# Patient Record
Sex: Male | Born: 1985
Health system: Southern US, Community
[De-identification: ages and names within clinical notes are randomized; demographics above are authoritative.]

## PROBLEM LIST (undated history)

## (undated) DIAGNOSIS — F1410 Cocaine abuse, uncomplicated: Secondary | ICD-10-CM

## (undated) DIAGNOSIS — F329 Major depressive disorder, single episode, unspecified: Secondary | ICD-10-CM

## (undated) DIAGNOSIS — F1010 Alcohol abuse, uncomplicated: Secondary | ICD-10-CM

## (undated) DIAGNOSIS — I10 Essential (primary) hypertension: Secondary | ICD-10-CM

## (undated) DIAGNOSIS — K219 Gastro-esophageal reflux disease without esophagitis: Secondary | ICD-10-CM

---

## 2011-01-14 ENCOUNTER — Emergency Department (INDEPENDENT_AMBULATORY_CARE_PROVIDER_SITE_OTHER): Payer: Medicaid Other

## 2011-01-14 ENCOUNTER — Other Ambulatory Visit: Payer: Self-pay

## 2011-01-14 ENCOUNTER — Emergency Department (HOSPITAL_BASED_OUTPATIENT_CLINIC_OR_DEPARTMENT_OTHER)
Admission: EM | Admit: 2011-01-14 | Discharge: 2011-01-14 | Disposition: A | Discharge status: Home / Self Care | Payer: Medicaid Other | Attending: Emergency Medicine | Admitting: Emergency Medicine

## 2011-01-14 DIAGNOSIS — R066 Hiccough: Secondary | ICD-10-CM

## 2011-01-14 DIAGNOSIS — R079 Chest pain, unspecified: Secondary | ICD-10-CM

## 2011-01-14 DIAGNOSIS — K219 Gastro-esophageal reflux disease without esophagitis: Secondary | ICD-10-CM | POA: Insufficient documentation

## 2011-01-14 LAB — BASIC METABOLIC PANEL
BUN: 13 mg/dL (ref 6–23)
Chloride: 101 mEq/L (ref 96–112)
Creatinine, Ser: 0.9 mg/dL (ref 0.50–1.35)
GFR calc Af Amer: >60 mL/min (ref >60)
GFR calc non Af Amer: >60 mL/min (ref >60)
Glucose, Bld: 99 mg/dL (ref 70–99)
Potassium: 3.5 mEq/L (ref 3.5–5.1)

## 2011-01-14 MED ORDER — LANSOPRAZOLE 15 MG PO CPDR: 15.0000 mg | DELAYED_RELEASE_CAPSULE | Freq: Every day | ORAL | Status: DC

## 2011-01-14 MED ORDER — GI COCKTAIL ~~LOC~~
30.0000 mL | Freq: Once | ORAL | Status: AC
  Administered 2011-01-14: 30 mL via ORAL
  Filled 2011-01-14: qty 30

## 2011-01-14 NOTE — ED Provider Notes (Signed)
History     CSN: [id number] Arrival date & time: 01/14/2011  7:25 PM  Chief Complaint  Patient presents with  . Hiccups  . Chest Pain   HPI Comments: Pt states that he also has hiccups intermittently throughout the week  Patient is a 25 y.o. male presenting with chest pain. The history is provided by the patient. No language interpreter was used.  Chest Pain The chest pain began 5 - 7 days ago. Chest pain occurs intermittently. The chest pain is unchanged. The pain is associated with eating. At its most intense, the pain is at 5/10. The pain is currently at 0/10. The quality of the pain is described as burning. The pain does not radiate. Chest pain is worsened by eating. Pertinent negatives for primary symptoms include no fever, no syncope, no shortness of breath, no cough, no wheezing, no palpitations and no dizziness. He tried histamine-2 blockers for the symptoms. Risk factors include alcohol intake.     History reviewed. No pertinent past medical history.  History reviewed. No pertinent past surgical history.  No family history on file.  History  Substance Use Topics  . Smoking status: Current Everyday Smoker  . Smokeless tobacco: Not on file  . Alcohol Use: Yes      Review of Systems  Constitutional: Negative for fever.  Respiratory: Negative for cough, shortness of breath and wheezing.   Cardiovascular: Positive for chest pain. Negative for palpitations and syncope.  Skin:       Pt states that he also feels like he has a lump on his left chest wall  Neurological: Negative for dizziness.  All other systems reviewed and are negative.    Physical Exam  BP 124/77  Pulse 56  Temp(Src) 98.9 F (37.2 C) (Oral)  Resp 18  Ht 5\' 8"  (1.727 m)  Wt 162 lb (73.483 kg)  BMI 24.63 kg/m2  SpO2 100%  Physical Exam  Nursing note and vitals reviewed. Constitutional: He is oriented to person, place, and time. He appears well-developed and well-nourished.  HENT:  Head:  Normocephalic and atraumatic.  Neck: Normal range of motion. Neck supple.  Cardiovascular: Normal rate and regular rhythm.   Pulmonary/Chest: Effort normal and breath sounds normal.  Abdominal: Bowel sounds are normal.  Musculoskeletal: Normal range of motion.  Neurological: He is alert and oriented to person, place, and time.  Skin: Skin is warm.  Psychiatric: He has a normal mood and affect.    ED Course  Procedures  Date: 01/14/2011  Rate: 62  Rhythm: normal sinus rhythm  QRS Axis: normal  Intervals:  ST/T Wave abnormalities: normal  Conduction Disutrbances:none  Narrative Interpretation:   Old EKG Reviewed: none available   Results for orders placed during the hospital encounter of 01/14/11  BASIC METABOLIC PANEL      Component Value Range   Sodium 136  135 - 145 (mEq/L)   Potassium 3.5  3.5 - 5.1 (mEq/L)   Chloride 101  96 - 112 (mEq/L)   CO2 23  19 - 32 (mEq/L)   Glucose, Bld 99  70 - 99 (mg/dL)   BUN 13  6 - 23 (mg/dL)   Creatinine, Ser 4.09  0.50 - 1.35 (mg/dL)   Calcium 9.6  8.4 - 81.1 (mg/dL)   GFR calc non Af Amer >60  >60 (mL/min)   GFR calc Af Amer >60  >60 (mL/min)   Dg Chest 2 View  01/14/2011  *RADIOLOGY REPORT*  Clinical Data: Chest pain, hiccups  CHEST -  2 VIEW  Comparison: None.  Findings: Lungs are clear. No pleural effusion or pneumothorax.  Cardiomediastinal silhouette is within normal limits.  Visualized osseous structures are within normal limits.  IMPRESSION: Normal chest radiographs.  Original Report Authenticated By: Charline Bills, M.D.    MDM Pt is feeling better after the gi cocktail:likely gerd pt is okay to follow up as need:pt hiccups subside with pt talking      Teressa Lower, NP 01/14/11 2142

## 2011-01-14 NOTE — ED Notes (Signed)
C/o hiccups x 7 days-CP x 4 days-knot to left side of chest-denies pain at present

## 2011-01-15 NOTE — ED Provider Notes (Signed)
Medical screening examination/treatment/procedure(s) were performed by non-physician practitioner and as supervising physician I was immediately available for consultation/collaboration.   Hanley Seamen, MD 01/15/11 [phone]

## 2013-06-11 ENCOUNTER — Emergency Department (HOSPITAL_BASED_OUTPATIENT_CLINIC_OR_DEPARTMENT_OTHER)
Admission: EM | Admit: 2013-06-11 | Discharge: 2013-06-12 | Disposition: A | Discharge status: Home / Self Care | Payer: 59 | Attending: Emergency Medicine | Admitting: Emergency Medicine

## 2013-06-11 ENCOUNTER — Encounter (HOSPITAL_BASED_OUTPATIENT_CLINIC_OR_DEPARTMENT_OTHER): Payer: Self-pay | Admitting: Emergency Medicine

## 2013-06-11 DIAGNOSIS — K920 Hematemesis: Secondary | ICD-10-CM

## 2013-06-11 DIAGNOSIS — F17200 Nicotine dependence, unspecified, uncomplicated: Secondary | ICD-10-CM | POA: Insufficient documentation

## 2013-06-11 DIAGNOSIS — L739 Follicular disorder, unspecified: Secondary | ICD-10-CM

## 2013-06-11 DIAGNOSIS — R109 Unspecified abdominal pain: Secondary | ICD-10-CM | POA: Insufficient documentation

## 2013-06-11 DIAGNOSIS — L678 Other hair color and hair shaft abnormalities: Secondary | ICD-10-CM | POA: Insufficient documentation

## 2013-06-11 DIAGNOSIS — F1010 Alcohol abuse, uncomplicated: Secondary | ICD-10-CM

## 2013-06-11 DIAGNOSIS — Z79899 Other long term (current) drug therapy: Secondary | ICD-10-CM | POA: Insufficient documentation

## 2013-06-11 DIAGNOSIS — L738 Other specified follicular disorders: Secondary | ICD-10-CM | POA: Insufficient documentation

## 2013-06-11 DIAGNOSIS — R066 Hiccough: Secondary | ICD-10-CM

## 2013-06-11 HISTORY — DX: Alcohol abuse, uncomplicated: F10.10

## 2013-06-11 MED ORDER — PROMETHAZINE HCL 25 MG/ML IJ SOLN
25.0000 mg | Freq: Once | INTRAMUSCULAR | Status: AC
  Administered 2013-06-12: 25 mg via INTRAVENOUS
  Filled 2013-06-11: qty 1

## 2013-06-11 NOTE — ED Notes (Addendum)
Vomiting blood  X 3 days. Sometimes it looks black and other times it is bright red. He vomits about 3 times a day. Cough x 3 days. Has been getting hiccups since this started. Right lateral rib pain.States he is not coughing blood. He normally drinks a case and a pint of liquor everyday. He has not had any alcohol in 4 days. He is agitated and cant sit still at triage. He has blisters on his right middle finger and left index finger he wants to have checked.

## 2013-06-12 LAB — CBC WITH DIFFERENTIAL/PLATELET
Basophils Absolute: 0 10*3/uL (ref 0.0–0.1)
Basophils Relative: 0 % (ref 0–1)
EOS PCT: 2 % (ref 0–5)
Eosinophils Absolute: 0.1 10*3/uL (ref 0.0–0.7)
HCT: 42.6 % (ref 39.0–52.0)
Hemoglobin: 14.9 g/dL (ref 13.0–17.0)
LYMPHS ABS: 2 10*3/uL (ref 0.7–4.0)
LYMPHS PCT: 38 % (ref 12–46)
MCH: 33.2 pg (ref 26.0–34.0)
MCHC: 35 g/dL (ref 30.0–36.0)
MCV: 94.9 fL (ref 78.0–100.0)
Monocytes Absolute: 0.5 10*3/uL (ref 0.1–1.0)
Monocytes Relative: 10 % (ref 3–12)
NEUTROS PCT: 50 % (ref 43–77)
Neutro Abs: 2.7 10*3/uL (ref 1.7–7.7)
PLATELETS: 223 10*3/uL (ref 150–400)
RBC: 4.49 MIL/uL (ref 4.22–5.81)
RDW: 12 % (ref 11.5–15.5)
WBC: 5.3 10*3/uL (ref 4.0–10.5)

## 2013-06-12 LAB — COMPREHENSIVE METABOLIC PANEL
ALK PHOS: 49 U/L (ref 39–117)
ALT: 25 U/L (ref 0–53)
AST: 28 U/L (ref 0–37)
Albumin: 4.4 g/dL (ref 3.5–5.2)
BILIRUBIN TOTAL: 1 mg/dL (ref 0.3–1.2)
BUN: 10 mg/dL (ref 6–23)
CO2: 23 meq/L (ref 19–32)
Calcium: 9.6 mg/dL (ref 8.4–10.5)
Chloride: 102 mEq/L (ref 96–112)
Creatinine, Ser: 1 mg/dL (ref 0.50–1.35)
GLUCOSE: 99 mg/dL (ref 70–99)
POTASSIUM: 3.6 meq/L — AB (ref 3.7–5.3)
SODIUM: 140 meq/L (ref 137–147)
Total Protein: 8.3 g/dL (ref 6.0–8.3)

## 2013-06-12 LAB — URINALYSIS, ROUTINE W REFLEX MICROSCOPIC
Glucose, UA: NEGATIVE mg/dL
HGB URINE DIPSTICK: NEGATIVE
Ketones, ur: 15 mg/dL — AB
Leukocytes, UA: NEGATIVE
Nitrite: NEGATIVE
PROTEIN: NEGATIVE mg/dL
Specific Gravity, Urine: 1.031 — ABNORMAL HIGH (ref 1.005–1.030)
UROBILINOGEN UA: 2 mg/dL — AB (ref 0.0–1.0)
pH: 6 (ref 5.0–8.0)

## 2013-06-12 LAB — OCCULT BLOOD GASTRIC / DUODENUM (SPECIMEN CUP)
Occult Blood, Gastric: POSITIVE — AB
pH, Gastric: 4

## 2013-06-12 LAB — LIPASE, BLOOD: Lipase: 72 U/L — ABNORMAL HIGH (ref 11–59)

## 2013-06-12 MED ORDER — CEPHALEXIN 500 MG PO CAPS: 500.0000 mg | ORAL_CAPSULE | Freq: Four times a day (QID) | ORAL | Status: DC

## 2013-06-12 MED ORDER — CHLORPROMAZINE HCL 25 MG PO TABS
25.0000 mg | ORAL_TABLET | Freq: Once | ORAL | Status: AC
  Administered 2013-06-12: 25 mg via ORAL
  Filled 2013-06-12: qty 1

## 2013-06-12 MED ORDER — CHLORPROMAZINE HCL 25 MG PO TABS: 25.0000 mg | ORAL_TABLET | Freq: Three times a day (TID) | ORAL | Status: DC

## 2013-06-12 NOTE — ED Provider Notes (Signed)
Medical screening examination/treatment/procedure(s) were conducted as a shared visit with non-physician practitioner(s) and myself.  I personally evaluated the patient during the encounter. Patient is a 28 year old male presents with complaints of persistent hiccups for the past 3 days. He states that this is causing him to vomit. He is reporting some upper abdominal pain but states he has vomited blood. He denies any fevers or chills. He denies any diarrhea or black stool.  On exam, vitals are stable and the patient is afebrile. Head is atraumatic normocephalic heart is regular rate and rhythm without murmurs. Lungs are clear and equal. Abdomen reveals mild tenderness in the epigastric region without rebound or guarding.  Workup reveals mildly elevated lipase which could be the explanation for his symptoms. He tells me he drinks approximately a 12 pack per day of beer. His symptoms could be related to pancreatitis and I have advised him to adhere to a clear liquid diet for the next 2 days. He'll also be recommended to take Prilosec and will also give Thorazine for his persistent hiccups. He understands to return if his pain worsens, he develops high fever, or bloody stool or other new or concerning symptoms.     Geoffery Lyonsouglas Alphonse Asbridge, MD 06/12/13 [phone]

## 2013-06-12 NOTE — Discharge Instructions (Signed)
Prilosec 20 mg twice daily for the next 2 weeks.  Thorazine as needed for hiccups.  Keflex as prescribed.  Followup with your primary Dr. if not improving in the next week.   Hematemesis This condition is the vomiting of blood. CAUSES  This can happen if you have a peptic ulcer or an irritation of the throat, stomach, or small bowel. Vomiting over and over again or swallowing blood from a nosebleed, coughing or facial injury can also result in bloody vomit. Anti-inflammatory pain medicines are a common cause of this potentially dangerous condition. The most serious causes of vomiting blood include:  Ulcers (a bacteria called H. pylori is common cause of ulcers).  Clotting problems.  Alcoholism.  Cirrhosis. TREATMENT  Treatment depends on the cause and the severity of the bleeding. Small amounts of blood streaks in the vomit is not the same as vomiting large amounts of bloody or dark, coffee grounds-like material. Weakness, fainting, dehydration, anemia, and continued alcohol or drug use increase the risk. Examination may include blood, vomit, or stool tests. The presence of bloody or dark stool that tests positive for blood (Hemoccult) means the bleeding has been going on for some time. Endoscopy and imaging studies may be done. Emergency treatment may include:  IV medicines or fluids.  Blood transfusions.  Surgery. Hospital care is required for high risk patients or when IV fluids or blood is needed. Upper GI bleeding can cause shock and death if not controlled. HOME CARE INSTRUCTIONS   Your treatment does not require hospital care at this time.  Remain at rest until your condition improves.  Drink clear liquids as tolerated.  Avoid:  Alcohol.  Nicotine.  Aspirin.  Any other anti-inflammatory medicine (ibuprofen, naproxen, and many others).  Medications to suppress stomach acid or vomiting may be needed. Take all your medicine as prescribed.  Be sure to see your  caregiver for follow-up as recommended. SEEK IMMEDIATE MEDICAL CARE IF:   You have repeated vomiting, dehydration, fainting, or extreme weakness.  You are vomiting large amounts of bloody or dark material.  You pass large, dark or bloody stools. Document Released: 07/01/2004 Document Revised: 08/16/2011 Document Reviewed: 07/17/2008 [facility] Patient Information 2014 [place], [place].  Hiccups Hiccups are caused by a sudden contraction of the muscles between the ribs and the muscle under your lungs (diaphragm). When you hiccup, the top of your windpipe (glottis) closes immediately after your diaphragm contracts. This makes the typical 'hic' sound. A hiccup is a reflex that you cannot stop. Unlike other reflexes such as coughing and sneezing, hiccups do not seem to have any useful purpose. There are 3 types of hiccups:   Benign bouts: last less than 48 hours.  Persistent: last more than 48 hours but less than 1 month.  Intractable: last more than 1 month. CAUSES  Most people have bouts of hiccups from time to time. They start for no apparent reason, last a short while, and then stop. Sometimes they are due to:  A temporary swollen stomach caused by overeating or eating too fast, eating spicy foods, drinking fizzy drinks, or swallowing air.  A sudden change in temperature (very hot or cold foods or drinks, a cold shower).  Drinking alcohol or using tobacco. There are no particular tests used to diagnose hiccups. Hiccups are usually considered harmless and do not point to a serious medical condition. However, there can be underlying medical problems that may cause hiccups, such as pneumonia, diabetes, metabolic problems, tumors, abdominal infections or injuries, and  neurologic problems.You must follow up with your caregiver if your symptoms persist or become a frequent problem. TREATMENT   Most cases need no treatment. A bout of benign hiccups usually does not last long.  Medicine is  sometimes needed to stop persistent hiccups. Medicine may be given intravenously (IV) or by mouth.  Hypnosis or acupuncture may be suggested.  Surgery to affect the nerve that supplies the diaphragm may be tried in severe cases.  Treatment of an underlying cause is needed in some cases. HOME CARE INSTRUCTIONS  Popular remedies that may stop a bout of hiccups include:  Gargling ice water.  Swallowing granulated sugar.  Biting on a lemon.  Holding your breath, breathing fast, or breathing into a paper bag.  Bearing down.  Gasping after a sudden fright.  Pulling your tongue gently.  Distraction. SEEK MEDICAL CARE IF:   Hiccups last for more than 48 hours.  You are given medicine but your hiccups do not get better.  New symptoms show up.  You cannot sleep or eat due to the hiccups.  You have unexpected weight loss.  You have trouble breathing or swallowing.  You develop severe pain in your abdomen or other areas.  You develop numbness, tingling, or weakness. Document Released: 08/02/2001 Document Revised: 08/16/2011 Document Reviewed: 07/15/2010 [facility] Patient Information 2014 [place], [place].

## 2013-06-12 NOTE — ED Provider Notes (Signed)
CSN: [id number]     Arrival date & time 06/11/13  2141 History   First MD Initiated Contact with Patient 06/11/13 2338     Chief Complaint  Patient presents with  . Hematemesis   (Consider location/radiation/quality/duration/timing/severity/associated sxs/prior Treatment) HPI Comments: Pt state that he started with hiccups, vomiting and upper abdominal pain 3 days ago:pt states that the vomit was initially dark and now it is red:pt states that he drinks at least a 12 pack of beer a day:pt states that his feels like it is his reflux but he can't get it to stop with tums and zantac:denies fever or diarrhea  The history is provided by the patient. No language interpreter was used.    Past Medical History  Diagnosis Date  . Alcohol abuse    History reviewed. No pertinent past surgical history. No family history on file. History  Substance Use Topics  . Smoking status: Current Every Day Smoker  . Smokeless tobacco: Not on file  . Alcohol Use: Yes    Review of Systems  Constitutional: Negative.   Respiratory: Negative.   Cardiovascular: Negative.     Allergies  Review of patient's allergies indicates no known allergies.  Home Medications   Current Outpatient Rx  Name  Route  Sig  Dispense  Refill  . EXPIRED: lansoprazole (PREVACID) 15 MG capsule   Oral   Take 1 capsule (15 mg total) by mouth daily.   30 capsule   0   . ranitidine (ZANTAC) 150 MG tablet   Oral   Take 150 mg by mouth 2 (two) times daily.            BP 138/90  Pulse 91  Temp(Src) 99.3 F (37.4 C) (Oral)  Resp 20  Ht 5\' 7"  (1.702 m)  Wt 162 lb (73.483 kg)  BMI 25.37 kg/m2  SpO2 100% Physical Exam  Nursing note and vitals reviewed. Constitutional: He is oriented to person, place, and time. He appears well-developed and well-nourished.  HENT:  Head: Normocephalic and atraumatic.  Cardiovascular: Normal rate and regular rhythm.   Pulmonary/Chest: Effort normal and breath sounds normal.   Abdominal: Soft. Bowel sounds are normal.  Epigastric tenderness  Musculoskeletal: Normal range of motion.  Neurological: He is alert and oriented to person, place, and time.  Skin: Skin is warm and dry.  Psychiatric: He has a normal mood and affect.    ED Course  Procedures (including critical care time) Labs Review Labs Reviewed  CBC WITH DIFFERENTIAL  COMPREHENSIVE METABOLIC PANEL  LIPASE, BLOOD  URINALYSIS, ROUTINE W REFLEX MICROSCOPIC  OCCULT BLOOD GASTRIC / DUODENUM (SPECIMEN CUP)   Imaging Review No results found.  EKG Interpretation   None       MDM  No diagnosis found.  Pt left with Dr. Judd Lienelo to follow upon labs    Teressa LowerVrinda Amyre Segundo, NP 06/12/13 0040

## 2014-03-24 ENCOUNTER — Encounter (HOSPITAL_BASED_OUTPATIENT_CLINIC_OR_DEPARTMENT_OTHER): Payer: Self-pay | Admitting: Emergency Medicine

## 2014-03-24 ENCOUNTER — Emergency Department (HOSPITAL_BASED_OUTPATIENT_CLINIC_OR_DEPARTMENT_OTHER)
Admission: EM | Admit: 2014-03-24 | Discharge: 2014-03-24 | Disposition: A | Discharge status: Home / Self Care | Payer: 59 | Attending: Emergency Medicine | Admitting: Emergency Medicine

## 2014-03-24 DIAGNOSIS — Z720 Tobacco use: Secondary | ICD-10-CM | POA: Insufficient documentation

## 2014-03-24 DIAGNOSIS — J029 Acute pharyngitis, unspecified: Secondary | ICD-10-CM | POA: Diagnosis not present

## 2014-03-24 DIAGNOSIS — Z79899 Other long term (current) drug therapy: Secondary | ICD-10-CM | POA: Insufficient documentation

## 2014-03-24 DIAGNOSIS — Z20828 Contact with and (suspected) exposure to other viral communicable diseases: Secondary | ICD-10-CM | POA: Diagnosis not present

## 2014-03-24 DIAGNOSIS — Z792 Long term (current) use of antibiotics: Secondary | ICD-10-CM | POA: Diagnosis not present

## 2014-03-24 DIAGNOSIS — Z202 Contact with and (suspected) exposure to infections with a predominantly sexual mode of transmission: Secondary | ICD-10-CM

## 2014-03-24 MED ORDER — CEPHALEXIN 500 MG PO CAPS
ORAL_CAPSULE | ORAL | Status: DC
Start: 2014-03-24 — End: 2015-02-19

## 2014-03-24 MED ORDER — ONDANSETRON 8 MG PO TBDP
8.0000 mg | ORAL_TABLET | Freq: Once | ORAL | Status: AC
  Administered 2014-03-24: 8 mg via ORAL
  Filled 2014-03-24: qty 1

## 2014-03-24 MED ORDER — METRONIDAZOLE 500 MG PO TABS
2000.0000 mg | ORAL_TABLET | Freq: Once | ORAL | Status: AC
  Administered 2014-03-24: 2000 mg via ORAL
  Filled 2014-03-24: qty 4

## 2014-03-24 NOTE — ED Provider Notes (Signed)
CSN: [id number]     Arrival date & time 03/24/14  1139 History   First MD Initiated Contact with Patient 03/24/14 1203     Chief Complaint  Patient presents with  . Urinary Tract Infection     (Consider location/radiation/quality/duration/timing/severity/associated sxs/prior Treatment) HPI 28 year old male has a couple days of a mild sore throat with no fever no drooling no stridor no voice change no neck pain no confusion no rash no chest pain no shortness of breath no abdominal pain no vomiting no diarrhea however his significant other has exudative pharyngitis and also tested positive for trichomonas the patient would like to be treated for trichomonas exposure as well. He has no abdominal pain no scrotal pain no penile discharge and no dysuria. Past Medical History  Diagnosis Date  . Alcohol abuse    History reviewed. No pertinent past surgical history. No family history on file. History  Substance Use Topics  . Smoking status: Current Every Day Smoker  . Smokeless tobacco: Not on file  . Alcohol Use: Yes    Review of Systems 10 Systems reviewed and are negative for acute change except as noted in the HPI.   Allergies  Review of patient's allergies indicates no known allergies.  Home Medications   Prior to Admission medications   Medication Sig Start Date End Date Taking? Authorizing Provider  cephALEXin (KEFLEX) 500 MG capsule Take 1 capsule (500 mg total) by mouth 4 (four) times daily. 06/12/13   Geoffery Lyonsouglas Delo, MD  cephALEXin (KEFLEX) 500 MG capsule 2 caps po bid x 7 days 03/24/14   Hurman HornJohn M Narada Uzzle, MD  chlorproMAZINE (THORAZINE) 25 MG tablet Take 1 tablet (25 mg total) by mouth 3 (three) times daily. 06/12/13   Geoffery Lyonsouglas Delo, MD  lansoprazole (PREVACID) 15 MG capsule Take 1 capsule (15 mg total) by mouth daily. 01/14/11 01/14/12  Teressa LowerVrinda Pickering, NP  ranitidine (ZANTAC) 150 MG tablet Take 150 mg by mouth 2 (two) times daily.      Historical Provider, MD   BP 137/85 mmHg  Pulse  67  Temp(Src) 97.7 F (36.5 C) (Oral)  Resp 20  Ht 5\' 6"  (1.676 m)  Wt 175 lb (79.379 kg)  BMI 28.26 kg/m2  SpO2 100% Physical Exam  Nursing note and vitals reviewed. Constitutional:  Awake, alert, nontoxic appearance.  HENT:  Head: Atraumatic.  Mouth/Throat: Oropharyngeal exudate present.  Bilateral tonsils with erythema and purulent exudates with midline uvula with no stridor no drooling no trismus  Eyes: Right eye exhibits no discharge. Left eye exhibits no discharge.  Neck: Neck supple.  Minimally tender anterior cervical lymphadenopathy bilaterally  Cardiovascular: Normal rate and regular rhythm.   No murmur heard. Pulmonary/Chest: Effort normal and breath sounds normal. No respiratory distress. He has no wheezes. He has no rales. He exhibits no tenderness.  Pulse oximetry normal room air 100%  Abdominal: Soft. He exhibits no distension. There is no tenderness. There is no rebound and no guarding.  Genitourinary:  Scrotum nontender  Musculoskeletal: He exhibits no tenderness.  Baseline ROM, no obvious new focal weakness.  Lymphadenopathy:    He has cervical adenopathy.  Neurological:  Mental status and motor strength appears baseline for patient and situation.  Skin: No rash noted.  Psychiatric: He has a normal mood and affect.    ED Course  Procedures (including critical care time) Labs Review Labs Reviewed - No data to display  Imaging Review No results found.   EKG Interpretation None      MDM  Final diagnoses:  Exudative pharyngitis  Exposure to trichomonas    Patient / Family / Caregiver informed of clinical course, understand medical decision-making process, and agree with plan. I doubt any other EMC precluding discharge at this time including, but not necessarily limited to the following:PTA.    Hurman HornJohn M Phat Dalton, MD 04/07/14 2033

## 2014-03-24 NOTE — ED Notes (Signed)
PT presents to ED with complaints of sore throat for a couple of days.

## 2015-02-19 ENCOUNTER — Encounter (HOSPITAL_BASED_OUTPATIENT_CLINIC_OR_DEPARTMENT_OTHER): Payer: Self-pay

## 2015-02-19 ENCOUNTER — Emergency Department (HOSPITAL_BASED_OUTPATIENT_CLINIC_OR_DEPARTMENT_OTHER)
Admission: EM | Admit: 2015-02-19 | Discharge: 2015-02-19 | Disposition: A | Discharge status: Home / Self Care | Payer: 59 | Attending: Emergency Medicine | Admitting: Emergency Medicine

## 2015-02-19 DIAGNOSIS — Y9389 Activity, other specified: Secondary | ICD-10-CM | POA: Diagnosis not present

## 2015-02-19 DIAGNOSIS — Y990 Civilian activity done for income or pay: Secondary | ICD-10-CM | POA: Insufficient documentation

## 2015-02-19 DIAGNOSIS — S29012A Strain of muscle and tendon of back wall of thorax, initial encounter: Secondary | ICD-10-CM

## 2015-02-19 DIAGNOSIS — Z720 Tobacco use: Secondary | ICD-10-CM | POA: Insufficient documentation

## 2015-02-19 DIAGNOSIS — S4992XA Unspecified injury of left shoulder and upper arm, initial encounter: Secondary | ICD-10-CM | POA: Insufficient documentation

## 2015-02-19 DIAGNOSIS — X58XXXA Exposure to other specified factors, initial encounter: Secondary | ICD-10-CM | POA: Diagnosis not present

## 2015-02-19 DIAGNOSIS — Y9289 Other specified places as the place of occurrence of the external cause: Secondary | ICD-10-CM | POA: Diagnosis not present

## 2015-02-19 DIAGNOSIS — S299XXA Unspecified injury of thorax, initial encounter: Secondary | ICD-10-CM | POA: Diagnosis present

## 2015-02-19 MED ORDER — CYCLOBENZAPRINE HCL 10 MG PO TABS: 10.0000 mg | ORAL_TABLET | Freq: Two times a day (BID) | ORAL | Status: DC | PRN

## 2015-02-19 MED ORDER — IBUPROFEN 600 MG PO TABS: 600.0000 mg | ORAL_TABLET | Freq: Four times a day (QID) | ORAL | Status: DC | PRN

## 2015-02-19 NOTE — Discharge Instructions (Signed)

## 2015-02-19 NOTE — ED Notes (Signed)
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

## 2015-02-19 NOTE — ED Provider Notes (Signed)
CSN: [id number]     Arrival date & time 02/19/15  1229 History   First MD Initiated Contact with Patient 02/19/15 1245     Chief Complaint  Patient presents with  . Spasms     (Consider location/radiation/quality/duration/timing/severity/associated sxs/prior Treatment) The history is provided by the patient.   patient presents with back pain. 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 He states that around 150 200 is normal. States he has pain in his left back today. Has had some pain before this. No numbness or weakness. No other injury. Pain is worse with movement. No chest pain or trouble breathing. No trauma  Past Medical History  Diagnosis Date  . Alcohol abuse    History reviewed. No pertinent past surgical history. No family history on file. Social History  Substance Use Topics  . Smoking status: Current Every Day Smoker  . Smokeless tobacco: None  . Alcohol Use: No    Review of Systems  Constitutional: Negative for fever.  Cardiovascular: Negative for chest pain.  Gastrointestinal: Negative for abdominal pain.  Musculoskeletal: Positive for back pain. Negative for neck pain.  Skin: Negative for wound.  Neurological: Negative for weakness and numbness.      Allergies  Review of patient's allergies indicates no known allergies.  Home Medications   Prior to Admission medications   Medication Sig Start Date End Date Taking? Authorizing Provider  cyclobenzaprine (FLEXERIL) 10 MG tablet Take 1 tablet (10 mg total) by mouth 2 (two) times daily as needed for muscle spasms. 02/19/15   Benjiman Core, MD  ibuprofen (ADVIL,MOTRIN) 600 MG tablet Take 1 tablet (600 mg total) by mouth every 6 (six) hours as needed. 02/19/15   Benjiman Core, MD  lansoprazole (PREVACID) 15 MG capsule Take 1 capsule (15 mg total) by mouth daily. 01/14/11 01/14/12  Teressa Lower, NP   BP 129/87 mmHg  Pulse 85  Temp(Src) 98.5 F (36.9 C) (Oral)  Resp 16  Ht   (1.702 m)  Wt 165 lb (74.844 kg)  BMI 25.84 kg/m2  SpO2 99% Physical Exam  Constitutional: He appears well-developed.  Cardiovascular: Normal rate.   Pulmonary/Chest: Effort normal.  Abdominal: Soft.  Musculoskeletal: He exhibits tenderness.  Mild tenderness on left posterior trapezius area. No rash. Neurovascular intact over left upper extremity.  Neurological: He is alert.  Skin: Skin is warm.    ED Course  Procedures (including critical care time) Labs Review Labs Reviewed - No data to display  Imaging Review No results found. I have personally reviewed and evaluated these images and lab results as part of my medical decision-making.   EKG Interpretation None      MDM   Final diagnoses:  Muscle strain of left upper back, initial encounter    Patient with likely muscle strain. Overall benign exam. Will discharge home. Patient requested a work note.Benjiman Core, MD 02/19/15 [phone]

## 2015-04-09 ENCOUNTER — Encounter (HOSPITAL_BASED_OUTPATIENT_CLINIC_OR_DEPARTMENT_OTHER): Payer: Self-pay | Admitting: *Deleted

## 2015-04-09 ENCOUNTER — Emergency Department (HOSPITAL_BASED_OUTPATIENT_CLINIC_OR_DEPARTMENT_OTHER)
Admission: EM | Admit: 2015-04-09 | Discharge: 2015-04-09 | Disposition: A | Discharge status: Home / Self Care | Payer: 59 | Attending: Physician Assistant | Admitting: Physician Assistant

## 2015-04-09 ENCOUNTER — Emergency Department (HOSPITAL_BASED_OUTPATIENT_CLINIC_OR_DEPARTMENT_OTHER): Payer: 59

## 2015-04-09 DIAGNOSIS — Z720 Tobacco use: Secondary | ICD-10-CM | POA: Insufficient documentation

## 2015-04-09 DIAGNOSIS — G933 Postviral fatigue syndrome: Secondary | ICD-10-CM | POA: Insufficient documentation

## 2015-04-09 DIAGNOSIS — R05 Cough: Secondary | ICD-10-CM | POA: Diagnosis present

## 2015-04-09 DIAGNOSIS — R058 Other specified cough: Secondary | ICD-10-CM

## 2015-04-09 MED ORDER — BENZONATATE 100 MG PO CAPS: 100.0000 mg | ORAL_CAPSULE | Freq: Three times a day (TID) | ORAL | Status: DC | PRN

## 2015-04-09 MED ORDER — IBUPROFEN 800 MG PO TABS: 800.0000 mg | ORAL_TABLET | Freq: Three times a day (TID) | ORAL | Status: DC

## 2015-04-09 MED ORDER — GUAIFENESIN-CODEINE 100-10 MG/5ML PO SOLN: 5.0000 mL | Freq: Four times a day (QID) | ORAL | Status: DC | PRN

## 2015-04-09 NOTE — Discharge Instructions (Signed)

## 2015-04-09 NOTE — ED Provider Notes (Signed)
CSN: [id number]     Arrival date & time 04/09/15  [id number] History   First MD Initiated Contact with Patient 04/09/15 0848     Chief Complaint  Patient presents with  . Cough     (Consider location/radiation/quality/duration/timing/severity/associated sxs/prior Treatment) HPI   Patient's a friend a 29 year old male has had 3 cough. He reports that streaks ago when it first started he had fevers. This resolved and now he's had a residual cough the last 3 weeks. Patient reports a cough symptoms at night. More recently has had no myalgias no fevers no congestion no nausea no vomiting.  Past Medical History  Diagnosis Date  . Alcohol abuse    History reviewed. No pertinent past surgical history. No family history on file. Social History  Substance Use Topics  . Smoking status: Current Every Day Smoker -- 0.50 packs/day    Types: Cigarettes  . Smokeless tobacco: Never Used  . Alcohol Use: No    Review of Systems  Constitutional: Negative for fever and activity change.  HENT: Negative for congestion, dental problem and ear discharge.   Eyes: Negative for discharge and redness.  Respiratory: Positive for cough. Negative for shortness of breath.   Cardiovascular: Negative for chest pain.  Gastrointestinal: Negative for abdominal pain.  Genitourinary: Negative for dysuria and urgency.  Musculoskeletal: Negative for myalgias and arthralgias.  Allergic/Immunologic: Negative for immunocompromised state.  Psychiatric/Behavioral: Negative for behavioral problems and agitation.  All other systems reviewed and are negative.     Allergies  Review of patient's allergies indicates no known allergies.  Home Medications   Prior to Admission medications   Medication Sig Start Date End Date Taking? Authorizing Provider  cyclobenzaprine (FLEXERIL) 10 MG tablet Take 1 tablet (10 mg total) by mouth 2 (two) times daily as needed for muscle spasms. 02/19/15   Benjiman CoreNathan Pickering, MD  ibuprofen  (ADVIL,MOTRIN) 600 MG tablet Take 1 tablet (600 mg total) by mouth every 6 (six) hours as needed. 02/19/15   Benjiman CoreNathan Pickering, MD  lansoprazole (PREVACID) 15 MG capsule Take 1 capsule (15 mg total) by mouth daily. 01/14/11 01/14/12  Teressa LowerVrinda Pickering, NP   BP 136/82 mmHg  Pulse 65  Temp(Src) 98.4 F (36.9 C) (Oral)  Resp 16  Ht 5\' 7"  (1.702 m)  Wt 175 lb (79.379 kg)  BMI 27.40 kg/m2  SpO2 100% Physical Exam  Constitutional: He is oriented to person, place, and time. He appears well-nourished.  HENT:  Head: Normocephalic.  Mouth/Throat: Oropharynx is clear and moist.  Eyes: Conjunctivae are normal.  Neck: No tracheal deviation present.  Cardiovascular: Normal rate.   Pulmonary/Chest: Effort normal. No stridor. No respiratory distress. He has no wheezes. He exhibits no tenderness.  Abdominal: Soft. There is no tenderness. There is no guarding.  Musculoskeletal: Normal range of motion. He exhibits no edema.  Neurological: He is oriented to person, place, and time. No cranial nerve deficit.  Skin: Skin is warm and dry. No rash noted. He is not diaphoretic.  Psychiatric: He has a normal mood and affect. His behavior is normal.  Nursing note and vitals reviewed.   ED Course  Procedures (including critical care time) Labs Review Labs Reviewed - No data to display  Imaging Review No results found. I have personally reviewed and evaluated these images and lab results as part of my medical decision-making.   EKG Interpretation None      MDM   Final diagnoses:  None    Patient is 29 year old male presenting with cough. Patient  sounds like he had a virus a couple weeks ago with fever and congestion. He's had a residual cough since then. I think this is a normal postviral cough. Will get chest x-ray to make sure he does not have developing pneumonia. Otherwise we'll treat with cough syrup with codeine at night. We will give him take ibuprofen during the day as well as Tessalon Perles to  help with any kind of pain he has with coughing.  Ariyana Faw Randall An, MD 04/09/15 [phone]

## 2015-04-09 NOTE — ED Notes (Signed)
Patient states he has a three week history of sinus congestion and drainage, sore throat and productive cough with brownish secreations.  Over the last few days his symptoms are associated with generalized body aches, and an intermittent fever.

## 2016-01-23 ENCOUNTER — Encounter (HOSPITAL_BASED_OUTPATIENT_CLINIC_OR_DEPARTMENT_OTHER): Payer: Self-pay | Admitting: *Deleted

## 2016-01-23 ENCOUNTER — Emergency Department (HOSPITAL_BASED_OUTPATIENT_CLINIC_OR_DEPARTMENT_OTHER)
Admission: EM | Admit: 2016-01-23 | Discharge: 2016-01-23 | Disposition: A | Discharge status: Home / Self Care | Payer: 59 | Attending: Dermatology | Admitting: Dermatology

## 2016-01-23 DIAGNOSIS — F17210 Nicotine dependence, cigarettes, uncomplicated: Secondary | ICD-10-CM | POA: Insufficient documentation

## 2016-01-23 DIAGNOSIS — M791 Myalgia: Secondary | ICD-10-CM | POA: Diagnosis not present

## 2016-01-23 DIAGNOSIS — Z5321 Procedure and treatment not carried out due to patient leaving prior to being seen by health care provider: Secondary | ICD-10-CM | POA: Insufficient documentation
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
## 2016-01-23 NOTE — ED Triage Notes (Signed)
Pt c/o generalized body aches , denies fever or chills

## 2016-09-16 ENCOUNTER — Encounter (HOSPITAL_BASED_OUTPATIENT_CLINIC_OR_DEPARTMENT_OTHER): Payer: Self-pay

## 2016-09-16 ENCOUNTER — Emergency Department (HOSPITAL_BASED_OUTPATIENT_CLINIC_OR_DEPARTMENT_OTHER): Payer: Medicaid Other

## 2016-09-16 ENCOUNTER — Emergency Department (HOSPITAL_BASED_OUTPATIENT_CLINIC_OR_DEPARTMENT_OTHER)
Admission: EM | Admit: 2016-09-16 | Discharge: 2016-09-16 | Disposition: A | Discharge status: Home / Self Care | Payer: Medicaid Other | Attending: Emergency Medicine | Admitting: Emergency Medicine

## 2016-09-16 DIAGNOSIS — Y939 Activity, unspecified: Secondary | ICD-10-CM | POA: Diagnosis not present

## 2016-09-16 DIAGNOSIS — S8001XA Contusion of right knee, initial encounter: Secondary | ICD-10-CM | POA: Diagnosis not present

## 2016-09-16 DIAGNOSIS — S7012XA Contusion of left thigh, initial encounter: Secondary | ICD-10-CM | POA: Insufficient documentation

## 2016-09-16 DIAGNOSIS — S79922A Unspecified injury of left thigh, initial encounter: Secondary | ICD-10-CM | POA: Diagnosis present

## 2016-09-16 DIAGNOSIS — Y999 Unspecified external cause status: Secondary | ICD-10-CM | POA: Insufficient documentation

## 2016-09-16 DIAGNOSIS — Y92410 Unspecified street and highway as the place of occurrence of the external cause: Secondary | ICD-10-CM | POA: Insufficient documentation

## 2016-09-16 MED ORDER — METHOCARBAMOL 500 MG PO TABS: 500.0000 mg | ORAL_TABLET | Freq: Every evening | ORAL | 0 refills | Status: DC | PRN

## 2016-09-16 MED ORDER — NAPROXEN 500 MG PO TABS: 500.0000 mg | ORAL_TABLET | Freq: Two times a day (BID) | ORAL | 0 refills | Status: AC

## 2016-09-16 MED ORDER — IBUPROFEN 800 MG PO TABS
800.0000 mg | ORAL_TABLET | Freq: Once | ORAL | Status: AC
  Administered 2016-09-16: 800 mg via ORAL
  Filled 2016-09-16: qty 1

## 2016-09-16 MED FILL — NAPROXEN 500 MG TABLET: 500 | 15 days supply | Qty: 30 | Fill #0

## 2016-09-16 MED FILL — METHOCARBAMOL 500 MG TABLET: 500 | 20 days supply | Qty: 20 | Fill #0
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
## 2016-09-16 NOTE — ED Notes (Signed)
Patient transported to X-ray 

## 2016-09-16 NOTE — Discharge Instructions (Signed)
As discussed, follow-up with the primary care provider, use naproxen and Robaxin as needed.  Robaxin as a muscle relaxer which she should use at bedtime. Do not take muscle relaxers with alcohol, while driving, or without any other mind altering medications.  Return to the emergency department if you experience leg swelling, calf pain, shortness of breath, chest pain, or any other new concerning symptoms.
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
## 2016-09-16 NOTE — ED Notes (Signed)
ED Provider at bedside. 

## 2016-10-13 ENCOUNTER — Encounter (HOSPITAL_BASED_OUTPATIENT_CLINIC_OR_DEPARTMENT_OTHER): Payer: Self-pay

## 2016-10-13 ENCOUNTER — Emergency Department (HOSPITAL_BASED_OUTPATIENT_CLINIC_OR_DEPARTMENT_OTHER)
Admission: EM | Admit: 2016-10-13 | Discharge: 2016-10-14 | Disposition: A | Discharge status: Home / Self Care | Payer: Medicaid Other | Attending: Emergency Medicine | Admitting: Emergency Medicine

## 2016-10-13 DIAGNOSIS — Y929 Unspecified place or not applicable: Secondary | ICD-10-CM | POA: Insufficient documentation

## 2016-10-13 DIAGNOSIS — W228XXA Striking against or struck by other objects, initial encounter: Secondary | ICD-10-CM | POA: Diagnosis not present

## 2016-10-13 DIAGNOSIS — R220 Localized swelling, mass and lump, head: Secondary | ICD-10-CM | POA: Diagnosis not present

## 2016-10-13 DIAGNOSIS — Y999 Unspecified external cause status: Secondary | ICD-10-CM | POA: Diagnosis not present

## 2016-10-13 DIAGNOSIS — Y939 Activity, unspecified: Secondary | ICD-10-CM | POA: Diagnosis not present

## 2016-10-13 DIAGNOSIS — S0993XA Unspecified injury of face, initial encounter: Secondary | ICD-10-CM | POA: Diagnosis present

## 2016-10-13 DIAGNOSIS — F17210 Nicotine dependence, cigarettes, uncomplicated: Secondary | ICD-10-CM | POA: Diagnosis not present

## 2016-10-13 MED ORDER — IBUPROFEN 600 MG PO TABS: 600.0000 mg | ORAL_TABLET | Freq: Four times a day (QID) | ORAL | 0 refills | Status: DC | PRN

## 2016-10-13 MED ORDER — CLINDAMYCIN HCL 300 MG PO CAPS: 300.0000 mg | ORAL_CAPSULE | Freq: Three times a day (TID) | ORAL | 0 refills | Status: DC
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
## 2016-10-13 NOTE — Discharge Instructions (Signed)
Take antibiotic for then next 10 days You can alternate ice and heat Take Ibuprofen for pain and swelling Return for worsening symptoms

## 2016-10-14 MED ORDER — IBUPROFEN 400 MG PO TABS
600.0000 mg | ORAL_TABLET | Freq: Once | ORAL | Status: AC
  Administered 2016-10-14: 600 mg via ORAL
  Filled 2016-10-14: qty 1

## 2016-10-14 MED ORDER — CLINDAMYCIN HCL 150 MG PO CAPS
300.0000 mg | ORAL_CAPSULE | Freq: Once | ORAL | Status: AC
  Administered 2016-10-14: 300 mg via ORAL
  Filled 2016-10-14: qty 2

## 2016-10-14 NOTE — ED Notes (Signed)
Pt verbalizes understanding of d/c instructions and denies any further needs at this time. 

## 2016-10-16 NOTE — ED Provider Notes (Signed)
Medical screening examination/treatment/procedure(s) were performed by non-physician practitioner and as supervising physician I was immediately available for consultation/collaboration.    Linwood DibblesKnapp, Synethia Endicott, MD 10/16/16 1027

## 2018-07-21 ENCOUNTER — Encounter (HOSPITAL_BASED_OUTPATIENT_CLINIC_OR_DEPARTMENT_OTHER): Payer: Self-pay

## 2018-07-21 ENCOUNTER — Other Ambulatory Visit: Payer: Self-pay

## 2018-07-21 ENCOUNTER — Emergency Department (HOSPITAL_BASED_OUTPATIENT_CLINIC_OR_DEPARTMENT_OTHER)
Admission: EM | Admit: 2018-07-21 | Discharge: 2018-07-21 | Disposition: A | Discharge status: Home / Self Care | Payer: Medicaid Other | Attending: Emergency Medicine | Admitting: Emergency Medicine

## 2018-07-21 DIAGNOSIS — J069 Acute upper respiratory infection, unspecified: Secondary | ICD-10-CM | POA: Diagnosis not present

## 2018-07-21 DIAGNOSIS — F17210 Nicotine dependence, cigarettes, uncomplicated: Secondary | ICD-10-CM | POA: Diagnosis not present

## 2018-07-21 DIAGNOSIS — B9789 Other viral agents as the cause of diseases classified elsewhere: Secondary | ICD-10-CM

## 2018-07-21 DIAGNOSIS — R05 Cough: Secondary | ICD-10-CM | POA: Diagnosis present

## 2018-07-21 HISTORY — DX: Gastro-esophageal reflux disease without esophagitis: K21.9

## 2018-07-21 HISTORY — DX: Major depressive disorder, single episode, unspecified: F32.9

## 2018-07-21 HISTORY — DX: Cocaine abuse, uncomplicated: F14.10

## 2018-07-21 MED ORDER — IBUPROFEN 800 MG PO TABS
800.0000 mg | ORAL_TABLET | Freq: Once | ORAL | Status: AC
  Administered 2018-07-21: 800 mg via ORAL
  Filled 2018-07-21: qty 1

## 2018-07-21 MED ORDER — BENZONATATE 100 MG PO CAPS: 100.0000 mg | ORAL_CAPSULE | Freq: Three times a day (TID) | ORAL | 0 refills | Status: DC

## 2018-07-21 MED ORDER — BENZONATATE 100 MG PO CAPS
100.0000 mg | ORAL_CAPSULE | Freq: Once | ORAL | Status: AC
  Administered 2018-07-21: 100 mg via ORAL
  Filled 2018-07-21: qty 1

## 2018-07-21 NOTE — Discharge Instructions (Signed)
Take tylenol 2 pills 4 times a day and motrin 4 pills 3 times a day.  Drink plenty of fluids.  Return for worsening shortness of breath, headache, confusion. Follow up with your family doctor.   Smoking is bad for your, see the information in the handout if you want help to quit.

## 2018-07-21 NOTE — ED Provider Notes (Signed)
[facility] EMERGENCY DEPARTMENT Provider Note   CSN: [id number] Arrival date & time: 07/21/18  1939     History   Chief Complaint Chief Complaint  Patient presents with  . Cough    HPI Jerry George is a 33 y.o. male.  32 yo M with a chief complaint of cough.  Going on for 5 days.  At the onset the patient had a fever which resolved after 1 day.  Since then has had a persistent cough.  Denies history of asthma.  He is a current everyday smoker.  Having some chest pain to bilateral ribs with coughing.  Denies significant shortness of breath denies earache denies headache.  Has a mild sore throat.  Denies abdominal pain vomiting or diarrhea.  Unsure of sick contacts.  The history is provided by the patient.  Cough  Cough characteristics:  Non-productive Severity:  Moderate Onset quality:  Gradual Duration:  5 days Timing:  Constant Progression:  Worsening Chronicity:  New Smoker: yes   Context: smoke exposure   Relieved by:  Nothing Worsened by:  Nothing Ineffective treatments:  None tried Associated symptoms: chest pain   Associated symptoms: no chills, no eye discharge, no fever, no headaches, no myalgias, no rash and no shortness of breath     Past Medical History:  Diagnosis Date  . Alcohol abuse   . Cocaine abuse (HCC)   . GERD (gastroesophageal reflux disease)   . Major depressive disorder     There are no active problems to display for this patient.   History reviewed. No pertinent surgical history.      Home Medications    Prior to Admission medications   Medication Sig Start Date End Date Taking? Authorizing Provider  benzonatate (TESSALON) 100 MG capsule Take 1 capsule (100 mg total) by mouth every 8 (eight) hours. 07/21/18   Melene Plan, DO  clindamycin (CLEOCIN) 300 MG capsule Take 1 capsule (300 mg total) by mouth 3 (three) times daily. 10/13/16   Bethel Born, PA-C  ibuprofen (ADVIL,MOTRIN) 600 MG tablet Take 1 tablet (600 mg  total) by mouth every 6 (six) hours as needed. 10/13/16   Bethel Born, PA-C  naproxen (NAPROSYN) 500 MG tablet Take 1 tablet (500 mg total) by mouth 2 (two) times daily with a meal. 09/16/16   Georgiana Shore, PA-C    Family History No family history on file.  Social History Social History   Tobacco Use  . Smoking status: Current Every Day Smoker    Packs/day: 0.50    Types: Cigarettes  . Smokeless tobacco: Never Used  Substance Use Topics  . Alcohol use: Yes    Comment: weekly  . Drug use: No     Allergies   Patient has no known allergies.   Review of Systems Review of Systems  Constitutional: Negative for chills and fever.  HENT: Positive for congestion. Negative for facial swelling.   Eyes: Negative for discharge and visual disturbance.  Respiratory: Positive for cough. Negative for shortness of breath.   Cardiovascular: Positive for chest pain. Negative for palpitations.  Gastrointestinal: Negative for abdominal pain, diarrhea and vomiting.  Musculoskeletal: Negative for arthralgias and myalgias.  Skin: Negative for color change and rash.  Neurological: Negative for tremors, syncope and headaches.  Psychiatric/Behavioral: Negative for confusion and dysphoric mood.     Physical Exam Updated Vital Signs BP 130/88 (BP Location: Left Arm)   Pulse 77   Temp 98.3 F (36.8 C) (Oral)   Resp  18   Ht 5\' 7"  (1.702 m)   Wt 81.6 kg   SpO2 98%   BMI 28.19 kg/m   Physical Exam Vitals signs and nursing note reviewed.  Constitutional:      Appearance: He is well-developed.  HENT:     Head: Normocephalic and atraumatic.     Comments: Swollen turbinates, posterior nasal drip, no noted sinus ttp, tm normal bilaterally.   Eyes:     Pupils: Pupils are equal, round, and reactive to light.  Neck:     Musculoskeletal: Normal range of motion and neck supple.     Vascular: No JVD.  Cardiovascular:     Rate and Rhythm: Normal rate and regular rhythm.     Heart  sounds: No murmur. No friction rub. No gallop.   Pulmonary:     Effort: No respiratory distress.     Breath sounds: No wheezing.  Abdominal:     General: There is no distension.     Tenderness: There is no guarding or rebound.  Musculoskeletal: Normal range of motion.  Skin:    Coloration: Skin is not pale.     Findings: No rash.  Neurological:     Mental Status: He is alert and oriented to person, place, and time.  Psychiatric:        Behavior: Behavior normal.      ED Treatments / Results  Labs (all labs ordered are listed, but only abnormal results are displayed) Labs Reviewed - No data to display  EKG None  Radiology No results found.  Procedures Procedures (including critical care time) Discussed smoking cessation with patient and was they were offerred resources to help stop.  Total time was 5 min CPT code 22449.   Medications Ordered in ED Medications  ibuprofen (ADVIL,MOTRIN) tablet 800 mg (has no administration in time range)  benzonatate (TESSALON) capsule 100 mg (has no administration in time range)     Initial Impression / Assessment and Plan / ED Course  I have reviewed the triage vital signs and the nursing notes.  Pertinent labs & imaging results that were available during my care of the patient were reviewed by me and considered in my medical decision making (see chart for details).     33 yo M with an upper respiratory illness going on for the past 5 days.  No longer having fevers and chills.  He is clear lung sounds on my exam no other noted bacterial source was found.  We will start him on cough medicine.  Other symptomatic care.  Discharge home.  8:02 PM:  I have discussed the diagnosis/risks/treatment options with the patient and family and believe the pt to be eligible for discharge home to follow-up with PCP. We also discussed returning to the ED immediately if new or worsening sx occur. We discussed the sx which are most concerning (e.g.,  sudden worsening pain, fever, inability to tolerate by mouth) that necessitate immediate return. Medications administered to the patient during their visit and any new prescriptions provided to the patient are listed below.  Medications given during this visit Medications  ibuprofen (ADVIL,MOTRIN) tablet 800 mg (has no administration in time range)  benzonatate (TESSALON) capsule 100 mg (has no administration in time range)     The patient appears reasonably screen and/or stabilized for discharge and I doubt any other medical condition or other [facility] requiring further screening, evaluation, or treatment in the ED at this time prior to discharge.    Final Clinical Impressions(s) /  ED Diagnoses   Final diagnoses:  Viral URI with cough    ED Discharge Orders         Ordered    benzonatate (TESSALON) 100 MG capsule  Every 8 hours     07/21/18 1959           Melene PlanFloyd, Ahnaf Caponi, DO 07/21/18 2002

## 2018-07-21 NOTE — ED Notes (Signed)
ED Provider at bedside. 

## 2018-07-21 NOTE — ED Triage Notes (Signed)
C/o flu like sx x 4 days-NAD-steady gait 

## 2018-12-14 ENCOUNTER — Emergency Department (HOSPITAL_BASED_OUTPATIENT_CLINIC_OR_DEPARTMENT_OTHER): Payer: Medicaid Other

## 2018-12-14 ENCOUNTER — Encounter (HOSPITAL_BASED_OUTPATIENT_CLINIC_OR_DEPARTMENT_OTHER)
Admission: EM | Disposition: A | Discharge status: Home / Self Care | Payer: Self-pay | Source: Home / Self Care | Attending: Emergency Medicine

## 2018-12-14 ENCOUNTER — Encounter (HOSPITAL_COMMUNITY): Payer: Self-pay | Admitting: Anesthesiology

## 2018-12-14 ENCOUNTER — Emergency Department (HOSPITAL_BASED_OUTPATIENT_CLINIC_OR_DEPARTMENT_OTHER)
Admission: EM | Admit: 2018-12-14 | Discharge: 2018-12-14 | Disposition: A | Discharge status: Home / Self Care | Payer: Medicaid Other | Attending: Emergency Medicine | Admitting: Emergency Medicine

## 2018-12-14 ENCOUNTER — Other Ambulatory Visit: Payer: Self-pay

## 2018-12-14 ENCOUNTER — Encounter (HOSPITAL_BASED_OUTPATIENT_CLINIC_OR_DEPARTMENT_OTHER): Payer: Self-pay | Admitting: Emergency Medicine

## 2018-12-14 DIAGNOSIS — F17210 Nicotine dependence, cigarettes, uncomplicated: Secondary | ICD-10-CM | POA: Diagnosis not present

## 2018-12-14 DIAGNOSIS — W230XXA Caught, crushed, jammed, or pinched between moving objects, initial encounter: Secondary | ICD-10-CM | POA: Diagnosis not present

## 2018-12-14 DIAGNOSIS — Y92810 Car as the place of occurrence of the external cause: Secondary | ICD-10-CM | POA: Insufficient documentation

## 2018-12-14 DIAGNOSIS — M79644 Pain in right finger(s): Secondary | ICD-10-CM | POA: Diagnosis present

## 2018-12-14 DIAGNOSIS — Y939 Activity, unspecified: Secondary | ICD-10-CM | POA: Diagnosis not present

## 2018-12-14 DIAGNOSIS — Y999 Unspecified external cause status: Secondary | ICD-10-CM | POA: Insufficient documentation

## 2018-12-14 DIAGNOSIS — L089 Local infection of the skin and subcutaneous tissue, unspecified: Secondary | ICD-10-CM | POA: Diagnosis not present

## 2018-12-14 SURGERY — IRRIGATION AND DEBRIDEMENT EXTREMITY
Anesthesia: Choice | Laterality: Right

## 2018-12-14 MED ORDER — ACETAMINOPHEN 325 MG PO TABS
650.0000 mg | ORAL_TABLET | Freq: Once | ORAL | Status: AC
  Administered 2018-12-14: 650 mg via ORAL
  Filled 2018-12-14: qty 2

## 2018-12-14 NOTE — ED Provider Notes (Signed)
[facility] EMERGENCY DEPARTMENT Provider Note   CSN: [id number] Arrival date & time: 12/14/18  1602    History   Chief Complaint Chief Complaint  Patient presents with  . Finger Injury    HPI Jerry George is a 33 y.o. male since emergency department today with chief complaint of right thumb pain x2-week.  Patient states he closed his right thumb in a car door accidentally.  He has had sharp shooting pain throughout his thumb since the injury.   He states the pain has been constant.  He rates the pain 8 and 10 in severity.   Also has had intermittent blood drainage from nail. He took ibuprofen without symptom relief. Denies numbness, weakness, fever, chills.  Past Medical History:  Diagnosis Date  . Alcohol abuse   . Cocaine abuse (HCC)   . GERD (gastroesophageal reflux disease)   . Major depressive disorder     There are no active problems to display for this patient.   History reviewed. No pertinent surgical history.      Home Medications    Prior to Admission medications   Medication Sig Start Date End Date Taking? Authorizing Provider  benzonatate (TESSALON) 100 MG capsule Take 1 capsule (100 mg total) by mouth every 8 (eight) hours. 07/21/18   Melene PlanFloyd, Dan, DO  clindamycin (CLEOCIN) 300 MG capsule Take 1 capsule (300 mg total) by mouth 3 (three) times daily. 10/13/16   Bethel BornGekas, Kelly Marie, PA-C  ibuprofen (ADVIL,MOTRIN) 600 MG tablet Take 1 tablet (600 mg total) by mouth every 6 (six) hours as needed. 10/13/16   Bethel BornGekas, Kelly Marie, PA-C  naproxen (NAPROSYN) 500 MG tablet Take 1 tablet (500 mg total) by mouth 2 (two) times daily with a meal. 09/16/16   Georgiana ShoreMitchell, Jessica B, PA-C    Family History No family history on file.  Social History Social History   Tobacco Use  . Smoking status: Current Every Day Smoker    Packs/day: 0.50    Types: Cigarettes  . Smokeless tobacco: Never Used  Substance Use Topics  . Alcohol use: Yes    Comment: weekly  . Drug use:  No     Allergies   Patient has no known allergies.   Review of Systems Review of Systems  Constitutional: Negative for chills and fever.  Musculoskeletal: Positive for arthralgias.  Skin: Positive for wound.  Allergic/Immunologic: Negative for immunocompromised state.  Neurological: Negative for weakness and numbness.     Physical Exam Updated Vital Signs BP (!) 141/96 (BP Location: Right Arm)   Pulse 77   Temp 98.9 F (37.2 C) (Oral)   Resp 18   Ht 5\' 8"  (1.727 m)   Wt 81.6 kg   SpO2 99%   BMI 27.37 kg/m   Physical Exam Vitals signs and nursing note reviewed.  Constitutional:      Appearance: He is well-developed. He is not ill-appearing or toxic-appearing.  HENT:     Head: Normocephalic and atraumatic.     Nose: Nose normal.  Eyes:     General: No scleral icterus.       Right eye: No discharge.        Left eye: No discharge.     Conjunctiva/sclera: Conjunctivae normal.  Neck:     Musculoskeletal: Normal range of motion.     Vascular: No JVD.  Cardiovascular:     Rate and Rhythm: Normal rate and regular rhythm.     Pulses: Normal pulses.  Radial pulses are 2+ on the right side and 2+ on the left side.     Heart sounds: Normal heart sounds.  Pulmonary:     Effort: Pulmonary effort is normal.     Breath sounds: Normal breath sounds.  Abdominal:     General: There is no distension.  Musculoskeletal: Normal range of motion.     Comments: Flexion and extension is intact at DIP and PIP and MCP joints of right thumb.  Sensation is intact.  Brisk cap refill. Dorsal aspect of thumb is tender to palpation. See picture below.  Full ROM of right wrist.  Skin:    General: Skin is warm and dry.  Neurological:     Mental Status: He is oriented to person, place, and time.     GCS: GCS eye subscore is 4. GCS verbal subscore is 5. GCS motor subscore is 6.     Comments: Fluent speech, no facial droop.  Psychiatric:        Behavior: Behavior normal.         ED Treatments / Results  Labs (all labs ordered are listed, but only abnormal results are displayed) Labs Reviewed - No data to display  EKG None  Radiology Dg Finger Thumb Right  Result Date: 12/14/2018 CLINICAL DATA:  Post RIGHT thumb in car door 2 weeks ago at a gas station, ongoing pain, swelling, and limited range of motion at distal phalanx EXAM: RIGHT THUMB 2+V COMPARISON:  None FINDINGS: Osseous mineralization normal. Joint spaces preserved. No acute fracture, dislocation, or bone destruction. Gas is identified deep to the nail bed and under the surface of the skin at the dorsum of the distal phalanx RIGHT thumb proximal to the nail. IMPRESSION: No acute osseous abnormalities. Soft tissue gas identified under the RIGHT thumb nail bed extending to the dorsal subcutaneous soft tissues of the distal phalanx proximal to the nail bed, could represent laceration or infection by a gas-forming organism. Findings called to Dr. Lita Mains on 12/14/2018 at 1656 hours. Electronically Signed   By: Lavonia Dana M.D.   On: 12/14/2018 16:56    Procedures Procedures (including critical care time)  Medications Ordered in ED Medications  acetaminophen (TYLENOL) tablet 650 mg (650 mg Oral Given 12/14/18 1644)     Initial Impression / Assessment and Plan / ED Course  I have reviewed the triage vital signs and the nursing notes.  Pertinent labs & imaging results that were available during my care of the patient were reviewed by me and considered in my medical decision making (see chart for details).  Patient presents to the ED with complaints of pain to the right thumb s/p closing in car door x 2 weeks ago. Exam without obvious deformity. ROM intact. Tender to palpation. NVI distally. Xray viewed by me is without fracture/dislocation however does show gas under thumb nail that extends into to the dorsal subcutaneous soft tissues of the distal phalanx. Pt has been seen and personally evaluated by attending  Dr. Lita Mains.  Spoke with Dr. Fredna Dow who recommends OR tonight  for washout. [place]bot Covid test was not performed here in [facility] ED as it would need to be repeated upon arrival for accuracy. Pt will need to be covid tested on arrival to [facility] ED. Discussed with attending Dr. Sedonia Small who accepts transfer.   This note was prepared using Dragon voice recognition software and may include unintentional dictation errors due to the inherent limitations of voice recognition software.   Final Clinical Impressions(s) /  ED Diagnoses   Final diagnoses:  Infection of thumb    ED Discharge Orders    None       Kathyrn Lasslbrizze, Aloysius Heinle E, PA-C 12/14/18 1840    Loren RacerYelverton, David, MD 12/14/18 2237

## 2018-12-14 NOTE — ED Notes (Signed)
Call received from [facility] ED that patient has not arrived per instructions; attempted to call patient on number listed; home/mobile number received a recording for another named per/household; called spouse's mobile number listed as well and received voicemail. Notified [place] charge RN.

## 2018-12-14 NOTE — ED Triage Notes (Signed)
R thumb was shut in the car door 2 weeks ago. Reports ongoing pain and issues with the finger nail.

## 2018-12-14 NOTE — ED Notes (Signed)
Pt. R thumb has been placed in dressing per EDP/  Pt. Tolerated well.  Pt. Reports pain is a 9/10 at this time.

## 2019-09-20 ENCOUNTER — Other Ambulatory Visit: Payer: Self-pay

## 2019-09-20 ENCOUNTER — Encounter (HOSPITAL_COMMUNITY): Payer: Self-pay | Admitting: Emergency Medicine

## 2019-09-20 ENCOUNTER — Emergency Department (HOSPITAL_COMMUNITY)
Admission: EM | Admit: 2019-09-20 | Discharge: 2019-09-20 | Disposition: A | Discharge status: Home / Self Care | Payer: Medicaid Other | Attending: Emergency Medicine | Admitting: Emergency Medicine

## 2019-09-20 DIAGNOSIS — M25512 Pain in left shoulder: Secondary | ICD-10-CM | POA: Insufficient documentation

## 2019-09-20 DIAGNOSIS — M542 Cervicalgia: Secondary | ICD-10-CM | POA: Diagnosis present

## 2019-09-20 DIAGNOSIS — Z5321 Procedure and treatment not carried out due to patient leaving prior to being seen by health care provider: Secondary | ICD-10-CM | POA: Diagnosis not present

## 2019-09-20 DIAGNOSIS — R0789 Other chest pain: Secondary | ICD-10-CM | POA: Diagnosis not present
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
## 2019-09-21 ENCOUNTER — Other Ambulatory Visit: Payer: Self-pay

## 2019-09-21 ENCOUNTER — Encounter (HOSPITAL_BASED_OUTPATIENT_CLINIC_OR_DEPARTMENT_OTHER): Payer: Self-pay | Admitting: Emergency Medicine

## 2019-09-21 ENCOUNTER — Emergency Department (HOSPITAL_BASED_OUTPATIENT_CLINIC_OR_DEPARTMENT_OTHER)
Admission: EM | Admit: 2019-09-21 | Discharge: 2019-09-21 | Disposition: A | Discharge status: Home / Self Care | Payer: Medicaid Other | Attending: Emergency Medicine | Admitting: Emergency Medicine

## 2019-09-21 ENCOUNTER — Emergency Department (HOSPITAL_BASED_OUTPATIENT_CLINIC_OR_DEPARTMENT_OTHER): Payer: Medicaid Other

## 2019-09-21 DIAGNOSIS — Z79899 Other long term (current) drug therapy: Secondary | ICD-10-CM | POA: Insufficient documentation

## 2019-09-21 DIAGNOSIS — Y92410 Unspecified street and highway as the place of occurrence of the external cause: Secondary | ICD-10-CM | POA: Insufficient documentation

## 2019-09-21 DIAGNOSIS — S02670A Fracture of alveolus of mandible, unspecified side, initial encounter for closed fracture: Secondary | ICD-10-CM | POA: Insufficient documentation

## 2019-09-21 DIAGNOSIS — F17210 Nicotine dependence, cigarettes, uncomplicated: Secondary | ICD-10-CM | POA: Insufficient documentation

## 2019-09-21 DIAGNOSIS — S0993XA Unspecified injury of face, initial encounter: Secondary | ICD-10-CM | POA: Diagnosis present

## 2019-09-21 DIAGNOSIS — Y9389 Activity, other specified: Secondary | ICD-10-CM | POA: Diagnosis not present

## 2019-09-21 DIAGNOSIS — Y999 Unspecified external cause status: Secondary | ICD-10-CM | POA: Diagnosis not present

## 2019-09-21 MED ORDER — IOHEXOL 300 MG/ML  SOLN
100.0000 mL | Freq: Once | INTRAMUSCULAR | Status: AC | PRN
  Administered 2019-09-21: 80 mL via INTRAVENOUS

## 2019-09-21 MED ORDER — HYDROCODONE-ACETAMINOPHEN 5-325 MG PO TABS: 1.0000 | ORAL_TABLET | Freq: Four times a day (QID) | ORAL | 0 refills | Status: DC | PRN

## 2019-09-21 MED FILL — HYDROCODON-APAP 5-325: 5-325 | 3 days supply | Qty: 10 | Fill #0

## 2019-09-21 NOTE — Discharge Instructions (Signed)
Follow back up with your dentist or see oral surgery.
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
## 2020-03-19 ENCOUNTER — Emergency Department (HOSPITAL_BASED_OUTPATIENT_CLINIC_OR_DEPARTMENT_OTHER): Payer: Medicaid Other

## 2020-03-19 ENCOUNTER — Other Ambulatory Visit: Payer: Self-pay

## 2020-03-19 ENCOUNTER — Encounter (HOSPITAL_BASED_OUTPATIENT_CLINIC_OR_DEPARTMENT_OTHER): Payer: Self-pay

## 2020-03-19 ENCOUNTER — Emergency Department (HOSPITAL_BASED_OUTPATIENT_CLINIC_OR_DEPARTMENT_OTHER)
Admission: EM | Admit: 2020-03-19 | Discharge: 2020-03-19 | Disposition: A | Discharge status: Home / Self Care | Payer: Medicaid Other | Attending: Emergency Medicine | Admitting: Emergency Medicine

## 2020-03-19 DIAGNOSIS — M7711 Lateral epicondylitis, right elbow: Secondary | ICD-10-CM | POA: Diagnosis not present

## 2020-03-19 DIAGNOSIS — F17210 Nicotine dependence, cigarettes, uncomplicated: Secondary | ICD-10-CM | POA: Diagnosis not present

## 2020-03-19 DIAGNOSIS — M25521 Pain in right elbow: Secondary | ICD-10-CM | POA: Diagnosis present

## 2020-03-19 NOTE — ED Provider Notes (Signed)
[facility] EMERGENCY DEPARTMENT Provider Note   CSN: [id number] Arrival date & time: 03/19/20  1119     History Chief Complaint  Patient presents with  . Elbow Pain    Jerry George is a 34 y.o. male who is right hand dominant presents to the ED today with complaint of gradual onset, constant, achy, right elbow pain x 2-3 weeks, worsening  Yesterday. Pt reports that he began having pain 2-3 weeks ago after throwing palates at work. He has been taking goody powders at home with mild relief however began having worsening pain again yesterday after repetitive throwing of palates again while at work prompting him to come to the ED for further evaluation. Pt reports that at times he will have a "shooting" pain go down his forearm especially while he is trying to sleep. He denies any previous injury to the elbow. Denies falling onto the elbow. No fevers, chills, redness/swelling to the joint. No other complaints at this time.   The history is provided by the patient and medical records.       Past Medical History:  Diagnosis Date  . Alcohol abuse   . Cocaine abuse (HCC)   . GERD (gastroesophageal reflux disease)   . Major depressive disorder     There are no problems to display for this patient.   History reviewed. No pertinent surgical history.     No family history on file.  Social History   Tobacco Use  . Smoking status: Current Every Day Smoker    Packs/day: 0.50    Types: Cigarettes  . Smokeless tobacco: Never Used  Vaping Use  . Vaping Use: Never used  Substance Use Topics  . Alcohol use: Not Currently  . Drug use: No    Home Medications Prior to Admission medications   Medication Sig Start Date End Date Taking? Authorizing Provider  benzonatate (TESSALON) 100 MG capsule Take 1 capsule (100 mg total) by mouth every 8 (eight) hours. 07/21/18   Melene Plan, DO  clindamycin (CLEOCIN) 300 MG capsule Take 1 capsule (300 mg total) by mouth 3 (three) times  daily. 10/13/16   Bethel Born, PA-C  HYDROcodone-acetaminophen (NORCO/VICODIN) 5-325 MG tablet Take 1 tablet by mouth every 6 (six) hours as needed. 09/21/19   Raeford Razor, MD  ibuprofen (ADVIL,MOTRIN) 600 MG tablet Take 1 tablet (600 mg total) by mouth every 6 (six) hours as needed. 10/13/16   Bethel Born, PA-C  naproxen (NAPROSYN) 500 MG tablet Take 1 tablet (500 mg total) by mouth 2 (two) times daily with a meal. 09/16/16   Georgiana Shore, PA-C    Allergies    Patient has no known allergies.  Review of Systems   Review of Systems  Constitutional: Negative for chills and fever.  Musculoskeletal: Positive for arthralgias. Negative for joint swelling.  Skin: Negative for color change.  Neurological: Negative for weakness and numbness.    Physical Exam Updated Vital Signs BP (!) 145/103 (BP Location: Right Arm)   Pulse 68   Temp 98.8 F (37.1 C) (Oral)   Resp 18   Ht 5\' 7"  (1.702 m)   Wt 86.2 kg   SpO2 100%   BMI 29.76 kg/m   Physical Exam Vitals and nursing note reviewed.  Constitutional:      Appearance: He is not ill-appearing.  HENT:     Head: Normocephalic and atraumatic.  Eyes:     Conjunctiva/sclera: Conjunctivae normal.  Cardiovascular:     Rate and Rhythm:  Normal rate and regular rhythm.  Pulmonary:     Effort: Pulmonary effort is normal.     Breath sounds: Normal breath sounds.  Musculoskeletal:     Comments: No obvious swelling, erythema, or increased warmth to R elbow joint. + TTP to the lateral epicondyle. ROM limited s/2 pain however pt able to fully extend and flex elbow. Strength 5/5. Sensation intact throughout. 2+ radial pulse. Negative Tinel's and Phalen's test.   Skin:    General: Skin is warm and dry.     Coloration: Skin is not jaundiced.  Neurological:     Mental Status: He is alert.     ED Results / Procedures / Treatments   Labs (all labs ordered are listed, but only abnormal results are displayed) Labs Reviewed - No data  to display  EKG None  Radiology DG Elbow Complete Right  Result Date: 03/19/2020 CLINICAL DATA:  Right elbow pain for 3 days EXAM: RIGHT ELBOW - COMPLETE 3+ VIEW COMPARISON:  None. FINDINGS: There is no evidence of fracture, dislocation, or joint effusion. There is no evidence of arthropathy or other focal bone abnormality. Soft tissues are unremarkable. IMPRESSION: Negative. Electronically Signed   By: Duanne Guess D.O.   On: 03/19/2020 11:51    Procedures Procedures (including critical care time)  Medications Ordered in ED Medications - No data to display  ED Course  I have reviewed the triage vital signs and the nursing notes.  Pertinent labs & imaging results that were available during my care of the patient were reviewed by me and considered in my medical decision making (see chart for details).    MDM Rules/Calculators/A&P                          34 year old male presenting to the ED today with complaint of atraumatic R elbow pain x 2-3 weeks with worsening pain yesterday after throwing palates at work. On arrival to the ED vitals are stable. No tachycardia or tachypneic and pt afebrile. BP is noted to be elevated at 145/103 however this appears to be pt's baseline. Not on antihypertensive meds at this time. An xray was taken of his R elbow prior to being seen - negative. On exam pt has no erythema, edema, or increased warmth to suggest septic arthritis at this time. His ROM is limited s/2 pain however he is able to fully extend and flex. He has TTP to the lateral epicondyle and expresses a shooting type pain at times; pt is right hand dominant and appears to do repetitve movements at work. He is negative for Phalen's and Tinel's sign; not consistent with carpal tunnel however there is concern for lateral epicondylitis at this time. Will treat for this today. RICE therapy discussed as well as elbow brace. Ibuprofen/Tylenol PRN for pain. He is to follow up with sports medicine  for further eval. Pt is in agreement with plan and stable for discharge home.   This note was prepared using Dragon voice recognition software and may include unintentional dictation errors due to the inherent limitations of voice recognition software.  Final Clinical Impression(s) / ED Diagnoses Final diagnoses:  Lateral epicondylitis of right elbow    Rx / DC Orders ED Discharge Orders    None       Discharge Instructions     Your xray did not show any fractures or other abnormalities today. Your symptoms are likely related to inflammation of your muscles/tendons in your  elbow (called tennis elbow).   While at home please rest, ice, and elevate your arm to reduce pain/swelling. I would recommend 600 mg Ibuprofen (3 OTC tablets) every 8 hours and 1,000 mg Tylenol (2 OTC double strength tablets) every 8 hours. You can alternate the medication every 4 hours.   I would also recommend picking up an elbow strap at any store including [facility], [facility], [facility]. Ask the pharmacist to help locate this for you. Wear the strap while at work or using your arm for repetitive motions.   Follow up with Sports Medicine Physician Dr. Jordan Likes for further eval.   Return to the ED for any worsening symptoms.        Tanda Rockers, PA-C 03/19/20 1221    Sabas Sous, MD 03/19/20 1515

## 2020-03-19 NOTE — ED Triage Notes (Signed)
Pt c/o pain to right elbow x 2 weeks-states he injured elbow last yesterday throwing palettes-NAD-steady gait

## 2020-03-19 NOTE — Discharge Instructions (Signed)
Your xray did not show any fractures or other abnormalities today. Your symptoms are likely related to inflammation of your muscles/tendons in your elbow (called tennis elbow).   While at home please rest, ice, and elevate your arm to reduce pain/swelling. I would recommend 600 mg Ibuprofen (3 OTC tablets) every 8 hours and 1,000 mg Tylenol (2 OTC double strength tablets) every 8 hours. You can alternate the medication every 4 hours.   I would also recommend picking up an elbow strap at any store including [employer], [facility], [facility]. Ask the pharmacist to help locate this for you. Wear the strap while at work or using your arm for repetitive motions.   Follow up with Sports Medicine Physician Dr. Jordan Likes for further eval.   Return to the ED for any worsening symptoms.

## 2020-03-20 ENCOUNTER — Telehealth: Payer: Self-pay | Admitting: Family Medicine

## 2020-03-20 NOTE — Telephone Encounter (Signed)
Called pt to office ED follow-up appt for Rt elbow pain--no answer & unable to leave msg (VMB full).  -glh

## 2020-07-23 ENCOUNTER — Emergency Department (HOSPITAL_BASED_OUTPATIENT_CLINIC_OR_DEPARTMENT_OTHER)
Admission: EM | Admit: 2020-07-23 | Discharge: 2020-07-23 | Disposition: A | Discharge status: Home / Self Care | Payer: Medicaid Other | Attending: Emergency Medicine | Admitting: Emergency Medicine

## 2020-07-23 ENCOUNTER — Encounter (HOSPITAL_BASED_OUTPATIENT_CLINIC_OR_DEPARTMENT_OTHER): Payer: Self-pay

## 2020-07-23 ENCOUNTER — Other Ambulatory Visit: Payer: Self-pay

## 2020-07-23 DIAGNOSIS — I10 Essential (primary) hypertension: Secondary | ICD-10-CM

## 2020-07-23 DIAGNOSIS — R55 Syncope and collapse: Secondary | ICD-10-CM | POA: Diagnosis not present

## 2020-07-23 DIAGNOSIS — F17210 Nicotine dependence, cigarettes, uncomplicated: Secondary | ICD-10-CM | POA: Insufficient documentation

## 2020-07-23 DIAGNOSIS — R110 Nausea: Secondary | ICD-10-CM | POA: Insufficient documentation

## 2020-07-23 LAB — BASIC METABOLIC PANEL
Anion gap: 11 (ref 5–15)
BUN: 11 mg/dL (ref 6–20)
CO2: 23 mmol/L (ref 22–32)
Calcium: 8.8 mg/dL — ABNORMAL LOW (ref 8.9–10.3)
Chloride: 103 mmol/L (ref 98–111)
Creatinine, Ser: 1.04 mg/dL (ref 0.61–1.24)
GFR, Estimated: >60 mL/min (ref >60)
Glucose, Bld: 112 mg/dL — ABNORMAL HIGH (ref 70–99)
Potassium: 3.4 mmol/L — ABNORMAL LOW (ref 3.5–5.1)
Sodium: 137 mmol/L (ref 135–145)

## 2020-07-23 LAB — CBC
HCT: 42.1 % (ref 39.0–52.0)
Hemoglobin: 14.5 g/dL (ref 13.0–17.0)
MCH: 32.2 pg (ref 26.0–34.0)
MCHC: 34.4 g/dL (ref 30.0–36.0)
MCV: 93.6 fL (ref 80.0–100.0)
Platelets: 262 10*3/uL (ref 150–400)
RBC: 4.5 MIL/uL (ref 4.22–5.81)
RDW: 12.5 % (ref 11.5–15.5)
WBC: 5.5 10*3/uL (ref 4.0–10.5)
nRBC: 0 % (ref 0.0–0.2)

## 2020-07-23 MED ORDER — AMLODIPINE BESYLATE 5 MG PO TABS: 5.0000 mg | ORAL_TABLET | Freq: Every day | ORAL | 0 refills | Status: AC

## 2020-07-23 NOTE — ED Provider Notes (Signed)
[facility] EMERGENCY DEPARTMENT Provider Note   CSN: [id number] Arrival date & time: 07/23/20  1616     History Chief Complaint  Patient presents with  . Loss of Consciousness    Jerry George is a 35 y.o. male.  35yo M w/ PMH below who p/w syncope. Pt states that yesterday evening, he went to the bathroom because he needed to vomit. He vomited and then got hot and passed out.  He bumped his left forehead on the way down.  He got up and about 10 minutes later, he stood up from a chair and walked towards a counter when he began feeling lightheaded again.  He had associated nausea.  He then passed out again in front of some others.  No reported seizure activity, he  remembers waking up on the ground with everyone standing around him. Today he has had occasional lightheadedness but none currently. No chest pain, SOB, dizziness/vertigo, vision changes, vomiting today, abdominal pain, or recent illness. He reports occasional alcohol use, drank heavily several days ago but not heavily yesterday or today. Denies illicit drug use. No FH heart disease. Does report FH of HTN in parents. He has never been diagnosed w/ HTN before.  The history is provided by the patient.  Loss of Consciousness      Past Medical History:  Diagnosis Date  . Alcohol abuse   . Cocaine abuse (HCC)   . GERD (gastroesophageal reflux disease)   . Major depressive disorder     There are no problems to display for this patient.   History reviewed. No pertinent surgical history.     No family history on file.  Social History   Tobacco Use  . Smoking status: Current Every Day Smoker    Packs/day: 0.50    Types: Cigarettes  . Smokeless tobacco: Never Used  Vaping Use  . Vaping Use: Never used  Substance Use Topics  . Alcohol use: Yes    Comment: weekly  . Drug use: No    Home Medications Prior to Admission medications   Medication Sig Start Date End Date Taking? Authorizing Provider   amLODipine (NORVASC) 5 MG tablet Take 1 tablet (5 mg total) by mouth daily. 07/23/20  Yes Dvon Jiles, Ambrose Finland, MD  ibuprofen (ADVIL,MOTRIN) 600 MG tablet Take 1 tablet (600 mg total) by mouth every 6 (six) hours as needed. 10/13/16   Bethel Born, PA-C  naproxen (NAPROSYN) 500 MG tablet Take 1 tablet (500 mg total) by mouth 2 (two) times daily with a meal. 09/16/16   Georgiana Shore, PA-C    Allergies    Patient has no known allergies.  Review of Systems   Review of Systems  Cardiovascular: Positive for syncope.   All other systems reviewed and are negative except that which was mentioned in HPI  Physical Exam Updated Vital Signs BP (!) 160/108 (BP Location: Left Arm)   Pulse 62   Temp 98.2 F (36.8 C) (Oral)   Resp 16   Ht 5\' 6"  (1.676 m)   Wt 94.3 kg   SpO2 99%   BMI 33.57 kg/m   Physical Exam Vitals and nursing note reviewed.  Constitutional:      General: He is not in acute distress.    Appearance: Normal appearance. He is well-developed.     Comments: Awake, alert  HENT:     Head: Normocephalic and atraumatic.     Mouth/Throat:     Mouth: Mucous membranes are moist.  Pharynx: Oropharynx is clear.  Eyes:     Extraocular Movements: Extraocular movements intact.     Conjunctiva/sclera: Conjunctivae normal.     Pupils: Pupils are equal, round, and reactive to light.  Cardiovascular:     Rate and Rhythm: Normal rate and regular rhythm.     Heart sounds: Normal heart sounds. No murmur heard.   Pulmonary:     Effort: Pulmonary effort is normal. No respiratory distress.     Breath sounds: Normal breath sounds.  Abdominal:     General: Bowel sounds are normal. There is no distension.     Palpations: Abdomen is soft.     Tenderness: There is no abdominal tenderness.  Musculoskeletal:     Cervical back: Neck supple.  Skin:    General: Skin is warm and dry.  Neurological:     Mental Status: He is alert and oriented to person, place, and time.      Cranial Nerves: No cranial nerve deficit.     Motor: No abnormal muscle tone.     Deep Tendon Reflexes: Reflexes are normal and symmetric.     Comments: Fluent speech, normal finger-to-nose testing, negative pronator drift, no clonus 5/5 strength and normal sensation x all 4 extremities  Psychiatric:        Mood and Affect: Mood normal.        Thought Content: Thought content normal.        Judgment: Judgment normal.     ED Results / Procedures / Treatments   Labs (all labs ordered are listed, but only abnormal results are displayed) Labs Reviewed  BASIC METABOLIC PANEL - Abnormal; Notable for the following components:      Result Value   Potassium 3.4 (*)    Glucose, Bld 112 (*)    Calcium 8.8 (*)    All other components within normal limits  CBC    EKG EKG Interpretation  Date/Time:  Wednesday July 23 2020 17:03:22 EST Ventricular Rate:  70 PR Interval:    QRS Duration: 81 QT Interval:  375 QTC Calculation: 405 R Axis:   56 Text Interpretation: Sinus rhythm Borderline T wave abnormalities Baseline wander in lead(s) I III aVL aVF since previous tracing, LVH changes have improved, inferior T wave inversions and Q waves have resolved Confirmed by Frederick Peers [phone]) on 07/23/2020 5:24:05 PM   Radiology No results found.  Procedures Procedures   Medications Ordered in ED Medications - No data to display   Orthostatic VS for the past 24 hrs:  BP- Lying Pulse- Lying BP- Sitting Pulse- Sitting BP- Standing at 0 minutes Pulse- Standing at 0 minutes  07/23/20 1737 -- -- (!) 163/113 64 (!) 162/117 68  07/23/20 1735 (!) 156/107 62 -- -- -- --      ED Course  I have reviewed the triage vital signs and the nursing notes.  Pertinent labs  that were available during my care of the patient were reviewed by me and considered in my medical decision making (see chart for details).    MDM Rules/Calculators/A&P                          PT alert, neuro intact on  exam, BP elevated but remainder of VS normal. Orthostatic VS negative. No concerning neurologic features to suggest intracranial process, do not feel head imaging is warranted currently. EKG without arrhythmia, is actually better than previous EKG showing LVH. Screening labs show no dehydration or anemia,  normal creatinine. He has remained persistently hypertensive here and chart review shows multiple previous visits documenting HTN. I advised that he will need to establish care with a PCP for management of this.  Recommended starting amlodipine and spent 5 minutes counseling on supportive measures for hypertension management including exercise, diet control, and decreased salt intake.  Reviewed return precautions with patient and he voiced understanding. Final Clinical Impression(s) / ED Diagnoses Final diagnoses:  Syncope, unspecified syncope type  Hypertension, unspecified type    Rx / DC Orders ED Discharge Orders         Ordered    amLODipine (NORVASC) 5 MG tablet  Daily        07/23/20 1925           Aryelle Figg, Ambrose Finland, MD 07/23/20 1947

## 2020-07-23 NOTE — ED Notes (Signed)
Patient verbalizes understanding of discharge instructions. Opportunity for questioning and answers were provided. Armband removed by staff, pt discharged from ED ambulatory to home with significant other.   

## 2020-07-23 NOTE — ED Triage Notes (Addendum)
Pt states he passed out twice yesterday-feels r/t to donating plasma and not eating-NAD-steady gait

## 2021-09-12 ENCOUNTER — Other Ambulatory Visit: Payer: Self-pay

## 2021-09-12 ENCOUNTER — Encounter (HOSPITAL_BASED_OUTPATIENT_CLINIC_OR_DEPARTMENT_OTHER): Payer: Self-pay | Admitting: *Deleted

## 2021-09-12 ENCOUNTER — Emergency Department (HOSPITAL_BASED_OUTPATIENT_CLINIC_OR_DEPARTMENT_OTHER): Payer: Medicaid Other

## 2021-09-12 ENCOUNTER — Emergency Department (HOSPITAL_BASED_OUTPATIENT_CLINIC_OR_DEPARTMENT_OTHER)
Admission: EM | Admit: 2021-09-12 | Discharge: 2021-09-12 | Disposition: A | Discharge status: Home / Self Care | Payer: Medicaid Other | Attending: Emergency Medicine | Admitting: Emergency Medicine

## 2021-09-12 DIAGNOSIS — L03116 Cellulitis of left lower limb: Secondary | ICD-10-CM | POA: Insufficient documentation

## 2021-09-12 DIAGNOSIS — M79671 Pain in right foot: Secondary | ICD-10-CM | POA: Diagnosis present

## 2021-09-12 LAB — CBC WITH DIFFERENTIAL/PLATELET
Abs Immature Granulocytes: 0.04 10*3/uL (ref 0.00–0.07)
Basophils Absolute: 0 10*3/uL (ref 0.0–0.1)
Basophils Relative: 0 %
Eosinophils Absolute: 0 10*3/uL (ref 0.0–0.5)
Eosinophils Relative: 1 %
HCT: 35.5 % — ABNORMAL LOW (ref 39.0–52.0)
Hemoglobin: 12.5 g/dL — ABNORMAL LOW (ref 13.0–17.0)
Immature Granulocytes: 1 %
Lymphocytes Relative: 22 %
Lymphs Abs: 1.2 10*3/uL (ref 0.7–4.0)
MCH: 33.4 pg (ref 26.0–34.0)
MCHC: 35.2 g/dL (ref 30.0–36.0)
MCV: 94.9 fL (ref 80.0–100.0)
Monocytes Absolute: 0.6 10*3/uL (ref 0.1–1.0)
Monocytes Relative: 11 %
Neutro Abs: 3.7 10*3/uL (ref 1.7–7.7)
Neutrophils Relative %: 65 %
Platelets: 212 10*3/uL (ref 150–400)
RBC: 3.74 MIL/uL — ABNORMAL LOW (ref 4.22–5.81)
RDW: 12.3 % (ref 11.5–15.5)
WBC: 5.6 10*3/uL (ref 4.0–10.5)
nRBC: 0 % (ref 0.0–0.2)

## 2021-09-12 LAB — BASIC METABOLIC PANEL
Anion gap: 7 (ref 5–15)
BUN: 9 mg/dL (ref 6–20)
CO2: 26 mmol/L (ref 22–32)
Calcium: 9.1 mg/dL (ref 8.9–10.3)
Chloride: 103 mmol/L (ref 98–111)
Creatinine, Ser: 1.02 mg/dL (ref 0.61–1.24)
GFR, Estimated: >60 mL/min (ref >60)
Glucose, Bld: 100 mg/dL — ABNORMAL HIGH (ref 70–99)
Potassium: 3.3 mmol/L — ABNORMAL LOW (ref 3.5–5.1)
Sodium: 136 mmol/L (ref 135–145)

## 2021-09-12 LAB — URIC ACID: Uric Acid, Serum: 7.8 mg/dL (ref 3.7–8.6)

## 2021-09-12 MED ORDER — CEPHALEXIN 250 MG PO CAPS
500.0000 mg | ORAL_CAPSULE | Freq: Once | ORAL | Status: AC
  Administered 2021-09-12: 500 mg via ORAL
  Filled 2021-09-12: qty 2

## 2021-09-12 MED ORDER — OXYCODONE-ACETAMINOPHEN 5-325 MG PO TABS
1.0000 | ORAL_TABLET | Freq: Once | ORAL | Status: AC
  Administered 2021-09-12: 1 via ORAL
  Filled 2021-09-12: qty 1

## 2021-09-12 MED ORDER — IOHEXOL 300 MG/ML  SOLN
100.0000 mL | Freq: Once | INTRAMUSCULAR | Status: AC | PRN
  Administered 2021-09-12: 100 mL via INTRAVENOUS

## 2021-09-12 MED ORDER — CEPHALEXIN 500 MG PO CAPS
500.0000 mg | ORAL_CAPSULE | Freq: Four times a day (QID) | ORAL | 0 refills | Status: AC
Start: 2021-09-12 — End: 2021-09-19

## 2021-09-12 NOTE — ED Notes (Signed)
MSE waiver explained and pt verbalizes understanding. Sig pad not functioning ?

## 2021-09-12 NOTE — ED Triage Notes (Signed)
Right foot swelling and pain x 1 week. Denies injury or wound. States pain is "burning" ?

## 2021-09-12 NOTE — ED Provider Notes (Signed)
?[facility] EMERGENCY DEPARTMENT ?Provider Note ? ? ?CSN: [id number] ?Arrival date & time: 09/12/21  1528 ? ?  ? ?History ?Chief Complaint  ?Patient presents with  ? Foot Pain  ? ? ?Caroll Mcgath is a 36 y.o. male who presents to the ED for evaluation of right foot pain and swelling that started almost 2 weeks ago.  Patient states that he "thinks" that he hit at work but is not sure.  Otherwise denies trauma or injury.  Pain and swelling have been progressively worsening over the last 2 weeks and is now difficult for him to walk.  He describes the pain as sharp/burning.  He has taken ibuprofen and Tylenol without significant improvement.  He denies fever, chills, numbness and tingling.  He has no other complaints ? ? ?Foot Pain ? ? ?  ? ?Home Medications ?Prior to Admission medications   ?Medication Sig Start Date End Date Taking? Authorizing Provider  ?amLODipine (NORVASC) 5 MG tablet Take 1 tablet (5 mg total) by mouth daily. 07/23/20  Yes Little, Wenda Overland, MD  ?cephALEXin (KEFLEX) 500 MG capsule Take 1 capsule (500 mg total) by mouth 4 (four) times daily for 7 days. 09/12/21 09/19/21 Yes Kathe Becton R, PA-C  ?ibuprofen (ADVIL,MOTRIN) 600 MG tablet Take 1 tablet (600 mg total) by mouth every 6 (six) hours as needed. 10/13/16   Recardo Evangelist, PA-C  ?naproxen (NAPROSYN) 500 MG tablet Take 1 tablet (500 mg total) by mouth 2 (two) times daily with a meal. 09/16/16   Emeline General, PA-C  ?   ? ?Allergies    ?Patient has no known allergies.   ? ?Review of Systems   ?Review of Systems ? ?Physical Exam ?Updated Vital Signs ?BP (!) 159/117   Pulse 77   Temp 99.4 ?F (37.4 ?C) (Oral)   Resp 16   Ht 5\' 8"  (1.727 m)   Wt 99.8 kg   SpO2 98%   BMI 33.45 kg/m?  ?Physical Exam ?Vitals and nursing note reviewed.  ?Constitutional:   ?   General: He is not in acute distress. ?   Appearance: He is not ill-appearing.  ?HENT:  ?   Head: Atraumatic.  ?Eyes:  ?   Conjunctiva/sclera: Conjunctivae normal.   ?Cardiovascular:  ?   Rate and Rhythm: Normal rate and regular rhythm.  ?   Pulses:     ?     Dorsalis pedis pulses are detected w/ Doppler on the right side and 2+ on the left side.  ?   Heart sounds: No murmur heard. ?Pulmonary:  ?   Effort: Pulmonary effort is normal. No respiratory distress.  ?   Breath sounds: Normal breath sounds.  ?Abdominal:  ?   General: Abdomen is flat. There is no distension.  ?   Palpations: Abdomen is soft.  ?   Tenderness: There is no abdominal tenderness.  ?Musculoskeletal:     ?   General: Normal range of motion.  ?   Cervical back: Normal range of motion.  ?Feet:  ?   Comments: Right ankle erythematous, swollen, quite tender to touch.  Swelling and erythema extends down the dorsum of the right foot. ? ?DP pulse detected with Doppler due to swelling ?Skin: ?   General: Skin is warm and dry.  ?   Capillary Refill: Capillary refill takes less than 2 seconds.  ?Neurological:  ?   General: No focal deficit present.  ?   Mental Status: He is alert.  ?Psychiatric:     ?  Mood and Affect: Mood normal.  ? ? ? ? ? ?ED Results / Procedures / Treatments   ?Labs ?(all labs ordered are listed, but only abnormal results are displayed) ?Labs Reviewed  ?BASIC METABOLIC PANEL - Abnormal; Notable for the following components:  ?    Result Value  ? Potassium 3.3 (*)   ? Glucose, Bld 100 (*)   ? All other components within normal limits  ?CBC WITH DIFFERENTIAL/PLATELET - Abnormal; Notable for the following components:  ? RBC 3.74 (*)   ? Hemoglobin 12.5 (*)   ? HCT 35.5 (*)   ? All other components within normal limits  ?URIC ACID  ? ? ?EKG ?None ? ?Radiology ?CT FOOT RIGHT W CONTRAST ? ?Result Date: 09/12/2021 ?CLINICAL DATA:  Foot pain with swelling for 1 week. Infection suspected. No acute injury. EXAM: CT OF THE LOWER RIGHT EXTREMITY WITH CONTRAST TECHNIQUE: Multidetector CT imaging of the lower right foot was performed according to the standard protocol following intravenous contrast  administration. RADIATION DOSE REDUCTION: This exam was performed according to the departmental dose-optimization program which includes automated exposure control, adjustment of the mA and/or kV according to patient size and/or use of iterative reconstruction technique. CONTRAST:  139mL OMNIPAQUE IOHEXOL 300 MG/ML  SOLN COMPARISON:  Radiographs same date. FINDINGS: Bones/Joint/Cartilage No evidence of acute fracture, dislocation or bone destruction. The joint spaces are preserved. There is a small to moderate tibiotalar joint effusion with mild synovial enhancement. No erosive changes or intra-articular loose bodies are identified. No other significant joint effusions are seen. Ligaments Suboptimally assessed by CT. Muscles and Tendons The ankle and foot tendons appear intact without tenosynovitis. No abnormal muscular enhancement or focal fluid collection identified. Soft tissues Mild nonspecific subcutaneous edema extending from the lateral aspect of the ankle into the dorsal aspect of the foot. No focal fluid collection, foreign body or soft tissue emphysema. No acute vascular findings are identified. Probable incidental pressure lesion plantar to the 5th MTP joint. IMPRESSION: 1. Nonspecific subcutaneous edema laterally at the ankle and extending into the dorsal aspect of the foot which could reflect cellulitis. No evidence of soft tissue abscess. 2. Nonspecific ankle joint effusion with mild synovial enhancement. Cannot exclude early septic joint. No evidence of osteomyelitis. 3. No acute osseous findings. Electronically Signed   By: Richardean Sale M.D.   On: 09/12/2021 19:37  ? ?DG Foot Complete Right ? ?Result Date: 09/12/2021 ?CLINICAL DATA:  Pain and swelling for 1 week. EXAM: RIGHT FOOT COMPLETE - 3+ VIEW COMPARISON:  None. FINDINGS: Three view study. There is no evidence of fracture or dislocation. There is no evidence of arthropathy or other focal bone abnormality. Soft tissues are unremarkable.  IMPRESSION: Negative. Electronically Signed   By: Misty Stanley M.D.   On: 09/12/2021 16:00   ? ?Procedures ?Procedures  ? ? ?Medications Ordered in ED ?Medications  ?oxyCODONE-acetaminophen (PERCOCET/ROXICET) 5-325 MG per tablet 1 tablet (1 tablet Oral Given 09/12/21 1709)  ?iohexol (OMNIPAQUE) 300 MG/ML solution 100 mL (100 mLs Intravenous Contrast Given 09/12/21 1922)  ?cephALEXin (KEFLEX) capsule 500 mg (500 mg Oral Given 09/12/21 2009)  ? ? ?ED Course/ Medical Decision Making/ A&P ?Clinical Course as of 09/12/21 2010  ?Sat Sep 12, 2021  ?2008 DG Foot Complete Right [EC]  ?  ?Clinical Course User Index ?[EC] Tonye Pearson, PA-C  ? ?                        ?Medical Decision Making ?  Amount and/or Complexity of Data Reviewed ?Labs: ordered. ?Radiology: ordered. ? ?Risk ?Prescription drug management. ? ? ?History:  ?Per HPI ?Social determinants of health: None ? ?Initial impression: ? ?This patient presents to the ED for concern of right foot and ankle pain, this involves an extensive number of treatment options, and is a complaint that carries with it a high risk of complications and morbidity.    ? ? ?Lab Tests and EKG: ? ?I Ordered, reviewed, and interpreted labs and EKG.  The pertinent results include:  ?CBC without acute findings ?BMP and uric acid levels normal ? ? ?Imaging Studies ordered: ? ?I ordered imaging studies including  ?Right foot xray without acute findings  ?I independently visualized and interpreted imaging and I agree with the radiologist interpretation.  ? ? ?Medicines ordered and prescription drug management: ? ?I ordered medication including: ?percocet   ?Reevaluation of the patient after these medicines showed that the patient improved ?I have reviewed the patients home medicines and have made adjustments as needed ? ?ED Course: ?36 year old male in no acute distress, nontoxic-appearing.,  His right ankle is grossly erythematous which extends down to the dorsum of his right foot.  No  fluctuance, induration.  Right ankle significantly tender to palpation.  Patient denies history of gout.  Neurovascularly intact.  Intact motor function.  He is ambulatory although slightly painful.  Immediate differentials include soft tissue inf

## 2021-09-12 NOTE — Discharge Instructions (Addendum)
The pain and inflammation of your foot is secondary to a skin infection.  Based on the CT findings, there is a chance that it is starting to work its way into your ankle joint.  I have started you on some antibiotics and given your first dose here in the emergency department, you will need to take it 4 times daily for 7 days.  Given the fact that this may affect the joint, I need you to follow-up with the provided orthopedic surgeon above later next week for reevaluation.  If at any point, the infection seems worse and you have increased pain, redness or swelling, please return to the emergency department for evaluation. ?

## 2022-06-08 IMAGING — CT CT FOOT*R* W/CM
3 of 6 series · 11 of 34 positions shown, 12 images · IV contrast (agent unspecified)
Comparison: Radiographs same date.

CLINICAL DATA: Foot pain with swelling for 1 week. Infection
suspected. No acute injury.

EXAM:
CT OF THE LOWER RIGHT EXTREMITY WITH CONTRAST
TECHNIQUE: Multidetector CT imaging of the lower right foot was performed
according to the standard protocol following intravenous contrast
administration.

[Series 6: axial bone · axial · 0.29mm/px · z∈[+352,+484]mm · 3 of 207 slices shown, 4 images]
[im 35/207  soft-tissue]
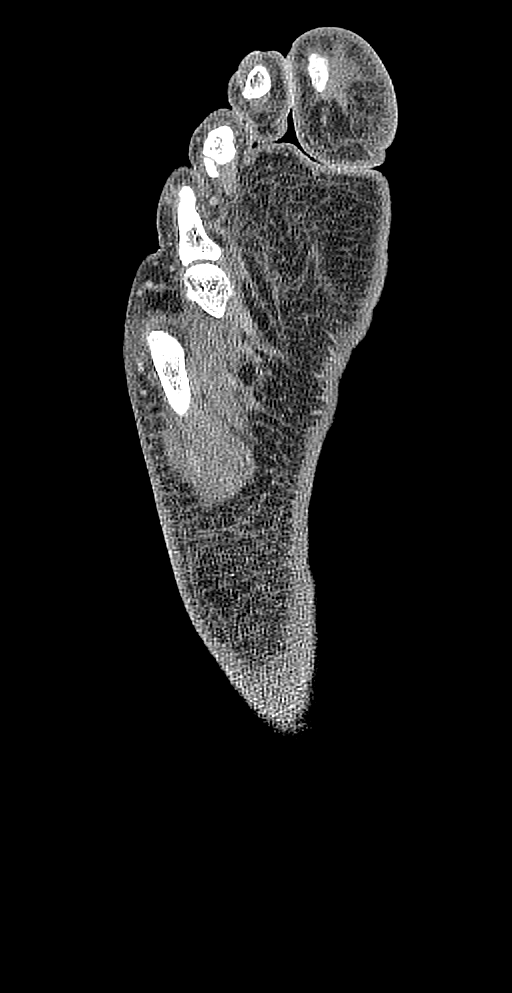
[im 35/207  bone]
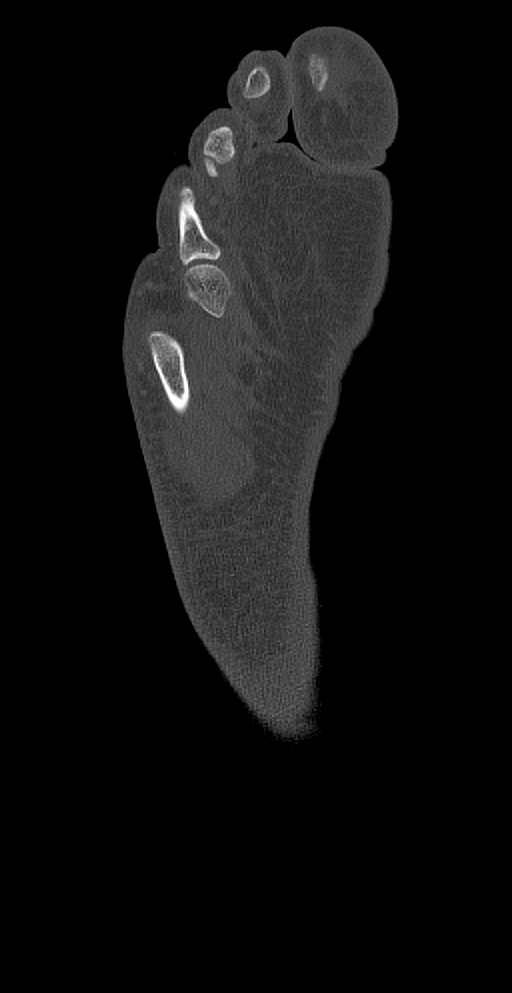
[im 104/207  bone]
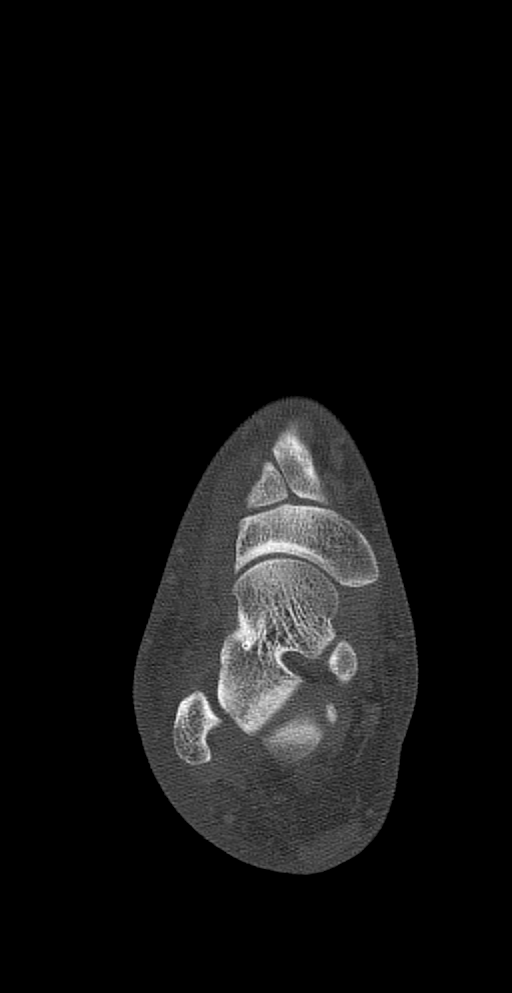
[im 172/207  bone]
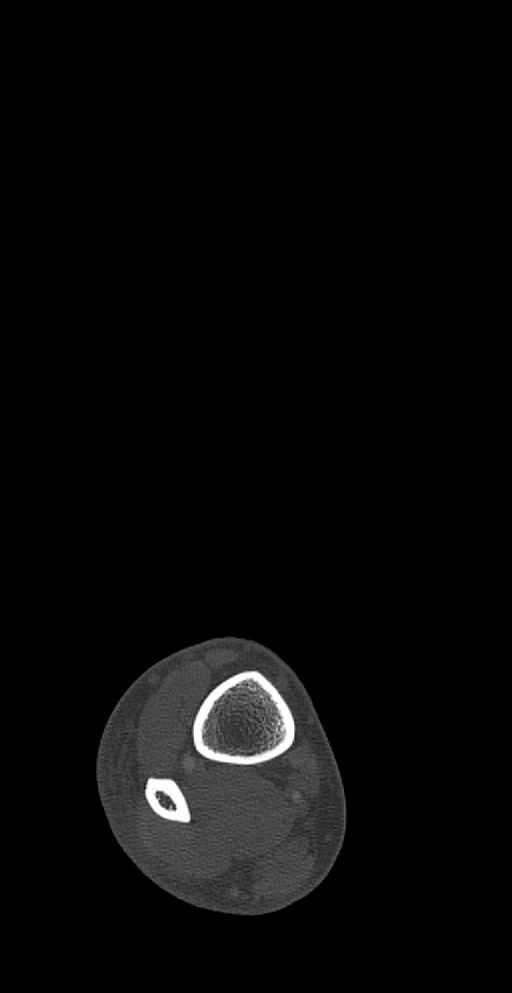

[Series 7: cor bone · coronal · 0.40mm/px · 3 of 279 slices shown]
[im 59/279  bone]
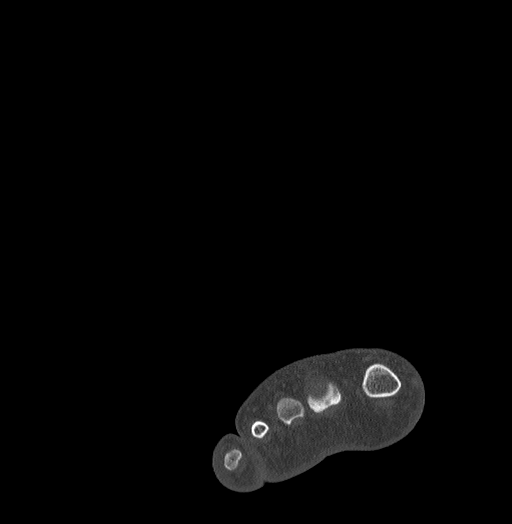
[im 126/279  bone]
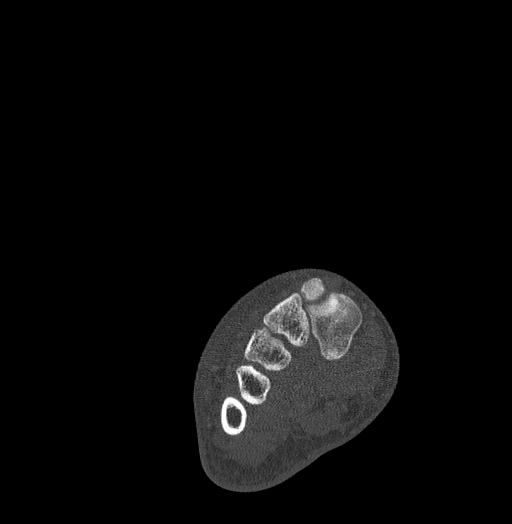
[im 194/279  bone]
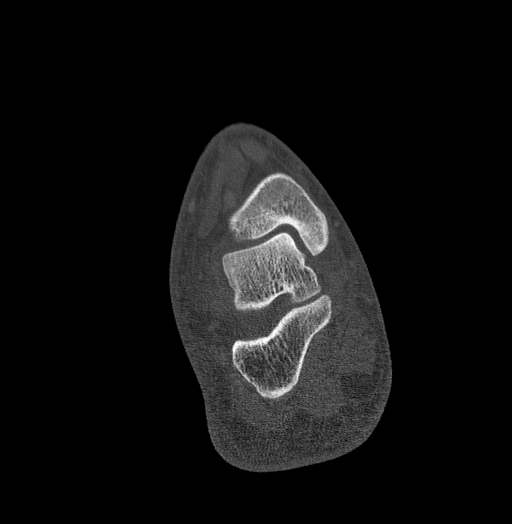

[Series 11: sag st · sagittal · 0.39mm/px · 5 of 139 slices shown]
[im 28/139  bone]
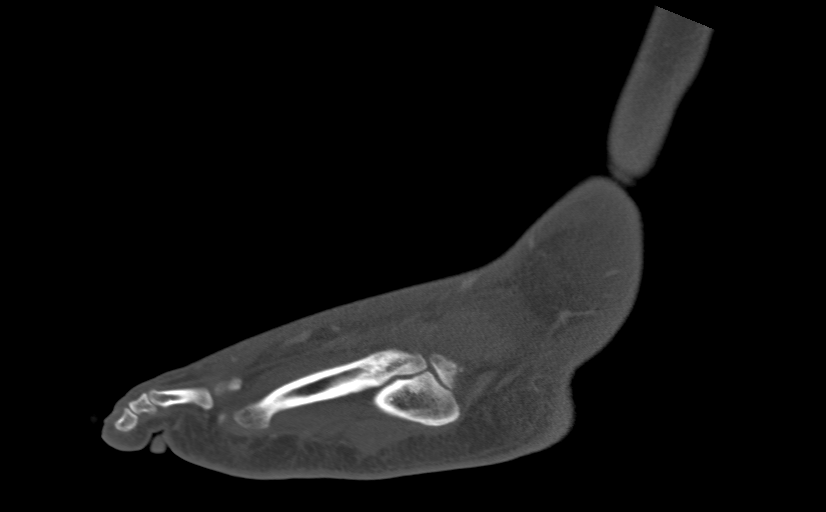
[im 49/139  soft-tissue]
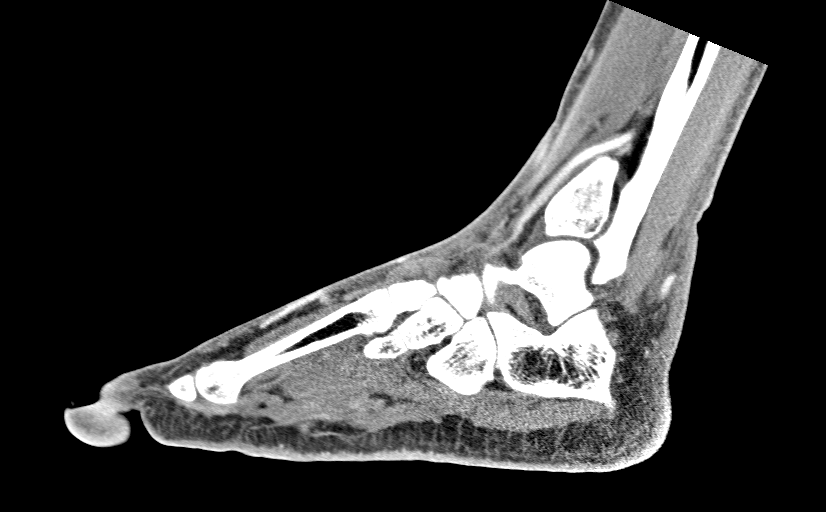
[im 56/139  bone]
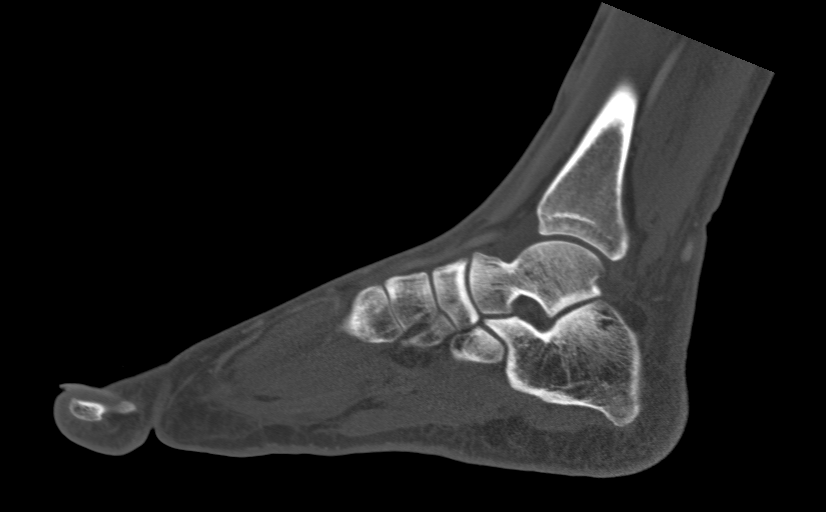
[im 83/139  bone]
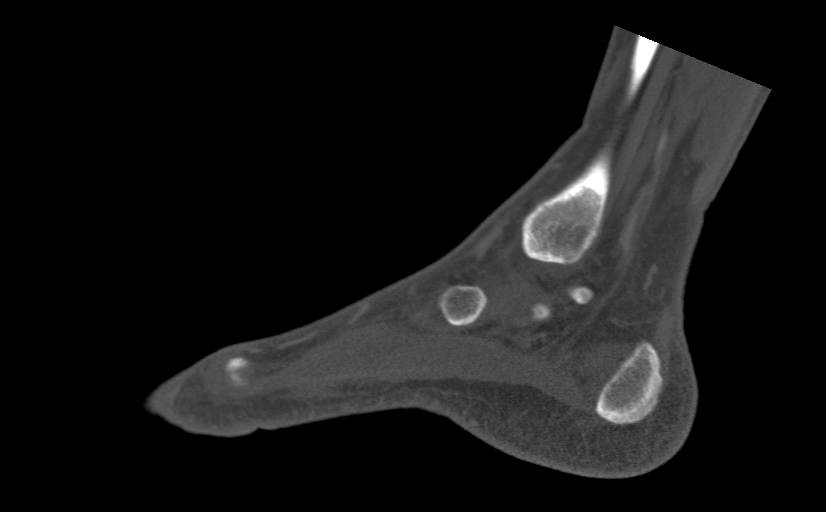
[im 111/139  bone]
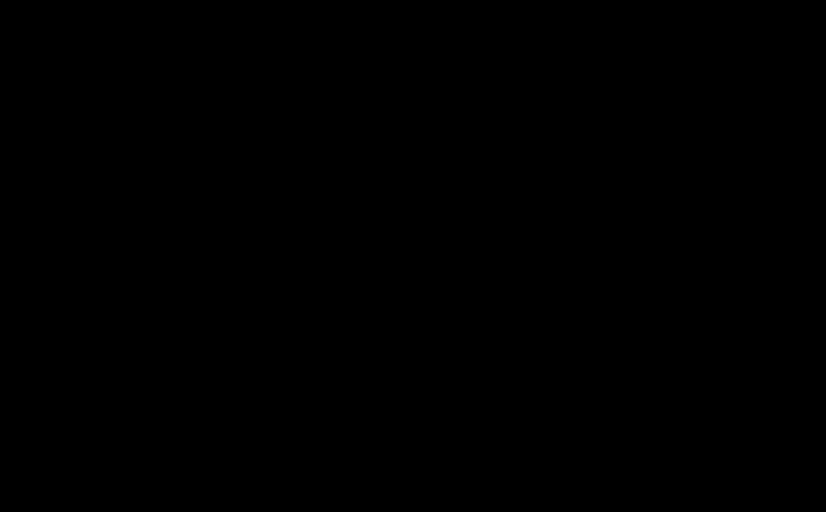

[11 of 34 positions shown; findings below may reference images not displayed]

RADIATION DOSE REDUCTION: This exam was performed according to the
departmental dose-optimization program which includes automated
exposure control, adjustment of the mA and/or kV according to
patient size and/or use of iterative reconstruction technique.

CONTRAST:  100mL OMNIPAQUE IOHEXOL 300 MG/ML  SOLN
FINDINGS: Bones/Joint/Cartilage

No evidence of acute fracture, dislocation or bone destruction. The
joint spaces are preserved. There is a small to moderate tibiotalar
joint effusion with mild synovial enhancement. No erosive changes or
intra-articular loose bodies are identified. No other significant
joint effusions are seen.

Ligaments

Suboptimally assessed by CT.

Muscles and Tendons

The ankle and foot tendons appear intact without tenosynovitis. No
abnormal muscular enhancement or focal fluid collection identified.

Soft tissues

Mild nonspecific subcutaneous edema extending from the lateral
aspect of the ankle into the dorsal aspect of the foot. No focal
fluid collection, foreign body or soft tissue emphysema. No acute
vascular findings are identified. Probable incidental pressure
lesion plantar to the 5th MTP joint.
IMPRESSION: 1. Nonspecific subcutaneous edema laterally at the ankle and
extending into the dorsal aspect of the foot which could reflect
cellulitis. No evidence of soft tissue abscess.
2. Nonspecific ankle joint effusion with mild synovial enhancement.
Cannot exclude early septic joint. No evidence of osteomyelitis.
3. No acute osseous findings.

## 2022-06-08 IMAGING — DX DG FOOT COMPLETE 3+V*R*
3 series · 3 of 3 positions shown · non-contrast
Comparison: None.

CLINICAL DATA: Pain and swelling for 1 week.

EXAM:
RIGHT FOOT COMPLETE - 3+ VIEW

[foot ap]
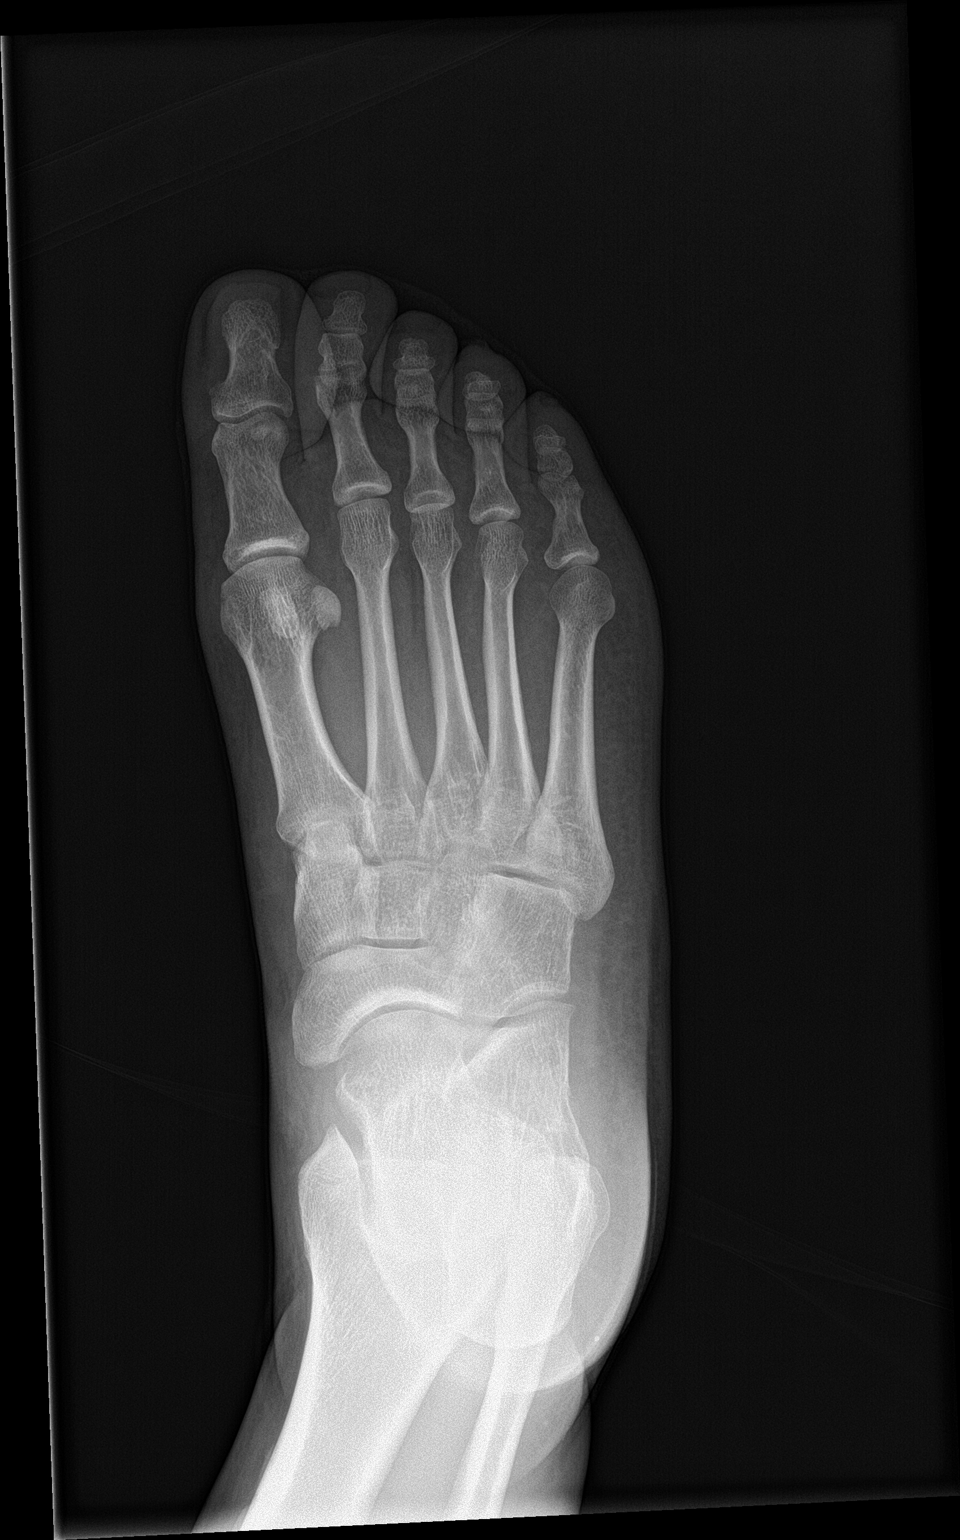

[foot obl]
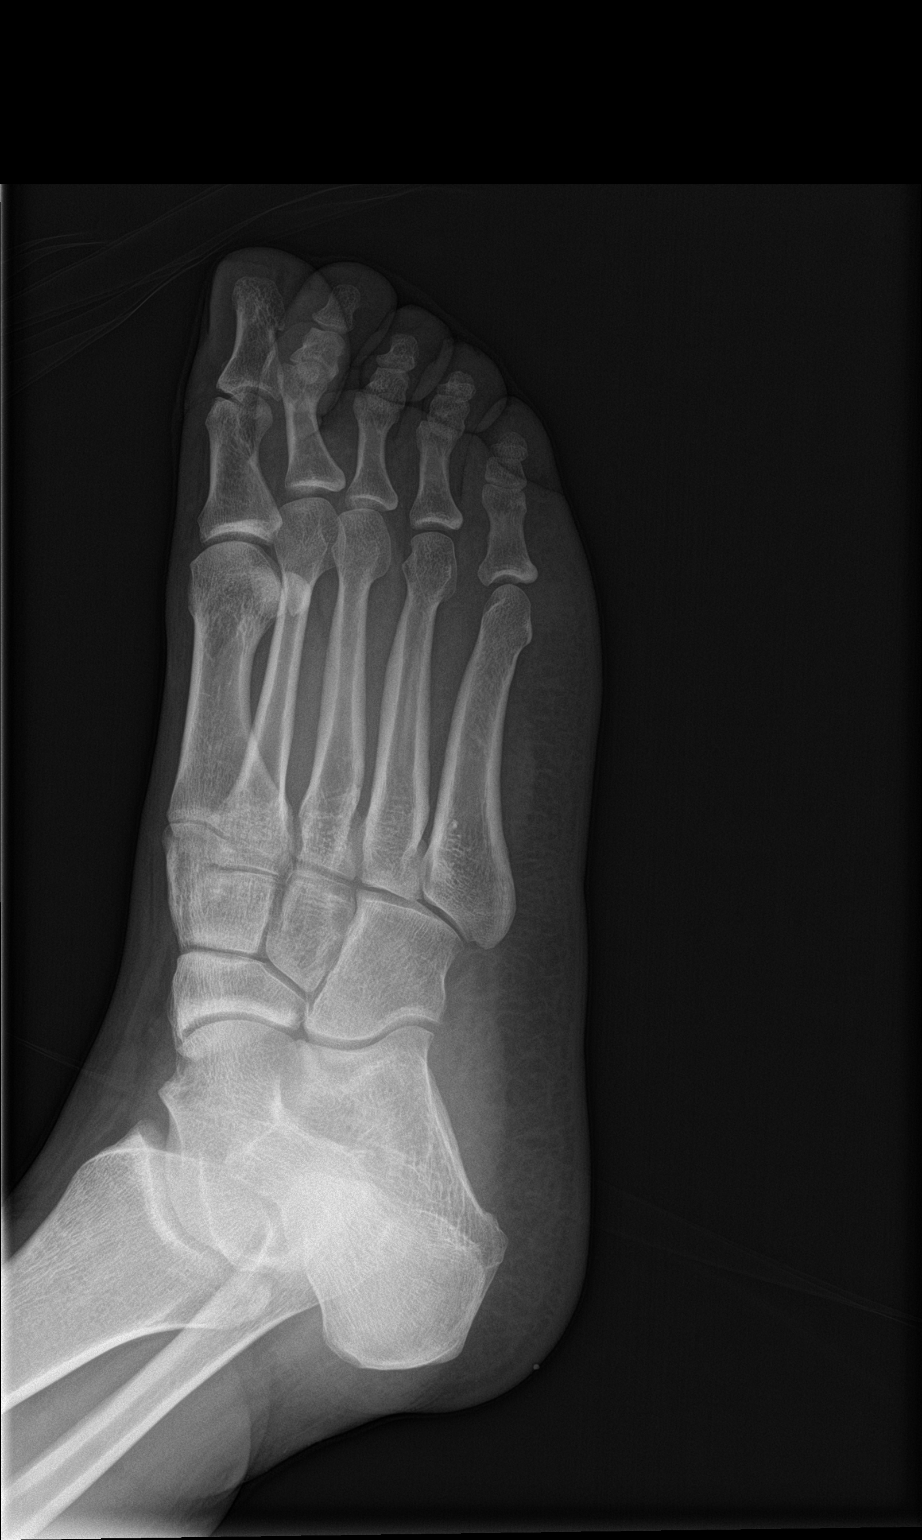

[foot lat]
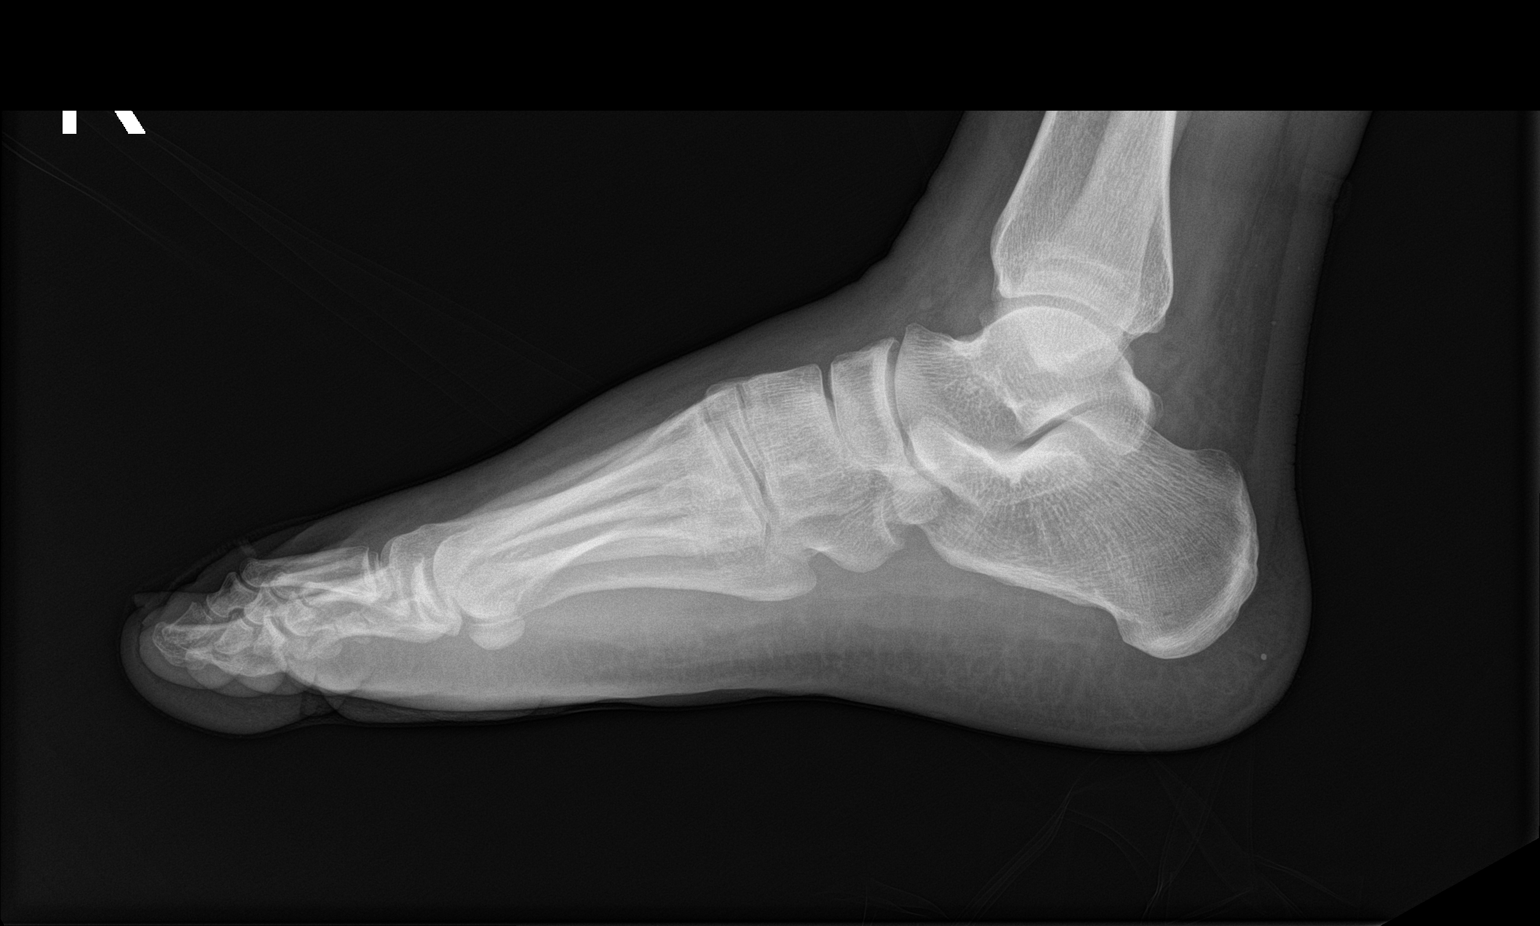

[3 of 3 positions shown; findings below may reference images not displayed]

FINDINGS: Three view study. There is no evidence of fracture or dislocation.
There is no evidence of arthropathy or other focal bone abnormality.
Soft tissues are unremarkable.
IMPRESSION: Negative.

## 2022-08-08 ENCOUNTER — Encounter (HOSPITAL_BASED_OUTPATIENT_CLINIC_OR_DEPARTMENT_OTHER): Payer: Self-pay | Admitting: Urology

## 2022-08-08 ENCOUNTER — Emergency Department (HOSPITAL_BASED_OUTPATIENT_CLINIC_OR_DEPARTMENT_OTHER)
Admission: EM | Admit: 2022-08-08 | Discharge: 2022-08-08 | Disposition: A | Discharge status: Home / Self Care | Payer: Medicaid Other | Attending: Emergency Medicine | Admitting: Emergency Medicine

## 2022-08-08 ENCOUNTER — Emergency Department (HOSPITAL_BASED_OUTPATIENT_CLINIC_OR_DEPARTMENT_OTHER): Payer: Medicaid Other

## 2022-08-08 ENCOUNTER — Other Ambulatory Visit: Payer: Self-pay

## 2022-08-08 DIAGNOSIS — J069 Acute upper respiratory infection, unspecified: Secondary | ICD-10-CM | POA: Diagnosis not present

## 2022-08-08 DIAGNOSIS — R059 Cough, unspecified: Secondary | ICD-10-CM | POA: Diagnosis present

## 2022-08-08 DIAGNOSIS — M940 Chondrocostal junction syndrome [Tietze]: Secondary | ICD-10-CM

## 2022-08-08 MED ORDER — IBUPROFEN 400 MG PO TABS
600.0000 mg | ORAL_TABLET | Freq: Once | ORAL | Status: AC
  Administered 2022-08-08: 600 mg via ORAL
  Filled 2022-08-08: qty 1

## 2022-08-08 MED ORDER — LIDOCAINE 5 % EX PTCH
1.0000 | MEDICATED_PATCH | CUTANEOUS | Status: DC
  Administered 2022-08-08: 1 via TRANSDERMAL
  Filled 2022-08-08: qty 1

## 2022-08-08 MED ORDER — BENZONATATE 100 MG PO CAPS
200.0000 mg | ORAL_CAPSULE | Freq: Once | ORAL | Status: AC
  Administered 2022-08-08: 200 mg via ORAL
  Filled 2022-08-08: qty 2

## 2022-08-08 MED ORDER — IBUPROFEN 400 MG PO TABS: 400.0000 mg | ORAL_TABLET | Freq: Four times a day (QID) | ORAL | 0 refills | Status: AC | PRN

## 2022-08-08 MED ORDER — LIDOCAINE 4 % EX PTCH
1.0000 | MEDICATED_PATCH | CUTANEOUS | 0 refills | Status: AC
Start: 2022-08-08 — End: ?

## 2022-08-08 MED ORDER — BENZONATATE 100 MG PO CAPS: 100.0000 mg | ORAL_CAPSULE | Freq: Three times a day (TID) | ORAL | 0 refills | Status: AC

## 2022-08-08 NOTE — ED Triage Notes (Signed)
Pt states right sided rib pain with cough  States cough x 4 days, productive  Denies any fever

## 2022-08-08 NOTE — ED Provider Notes (Signed)
[facility] Provider Note   CSN: [id number] Arrival date & time: 08/08/22  1030     History  Chief Complaint  Patient presents with   Cough   Rib Injury    Jerry George is a 37 y.o. male, no pertinent past medical history, who presents to the ED secondary to a productive cough for the last couple days, and now right-sided posterior rib cage pain.  Denies any blood in sputum, no swelling of extremities.  Denies any fevers or chills.  Believes he has a cold.  Has not tried anything for the pain.  Denies any shortness of breath or chest pain.    Home Medications Prior to Admission medications   Medication Sig Start Date End Date Taking? Authorizing Provider  benzonatate (TESSALON) 100 MG capsule Take 1 capsule (100 mg total) by mouth every 8 (eight) hours. 08/08/22  Yes Janda Cargo L, PA  ibuprofen (ADVIL) 400 MG tablet Take 1 tablet (400 mg total) by mouth every 6 (six) hours as needed. 08/08/22  Yes Nicholette Dolson L, PA  lidocaine (HM LIDOCAINE PATCH) 4 % Place 1 patch onto the skin daily. 08/08/22  Yes Aero Drummonds L, PA  amLODipine (NORVASC) 5 MG tablet Take 1 tablet (5 mg total) by mouth daily. 07/23/20   Little, Wenda Overland, MD  naproxen (NAPROSYN) 500 MG tablet Take 1 tablet (500 mg total) by mouth 2 (two) times daily with a meal. 09/16/16   Emeline General, PA-C      Allergies    Patient has no known allergies.    Review of Systems   Review of Systems  Respiratory:  Positive for cough. Negative for shortness of breath.     Physical Exam Updated Vital Signs BP (!) 160/117 (BP Location: Left Arm) Comment: pt states he is non-compliant with taking his BP medication.  Pulse 80   Temp 98.9 F (37.2 C) (Oral)   Resp 18   Ht '5\' 8"'$  (1.727 m)   Wt 99.8 kg   SpO2 97%   BMI 33.45 kg/m  Physical Exam Vitals and nursing note reviewed.  Constitutional:      General: He is not in acute distress.    Appearance: He is  well-developed.  HENT:     Head: Normocephalic and atraumatic.     Mouth/Throat:     Mouth: Mucous membranes are moist.  Eyes:     Conjunctiva/sclera: Conjunctivae normal.  Cardiovascular:     Rate and Rhythm: Normal rate and regular rhythm.     Heart sounds: No murmur heard. Pulmonary:     Effort: Pulmonary effort is normal. No respiratory distress.     Breath sounds: Normal breath sounds.  Abdominal:     Palpations: Abdomen is soft.     Tenderness: There is no abdominal tenderness.  Musculoskeletal:        General: No swelling.     Cervical back: Neck supple.     Comments: Tenderness to palpation of right posterior rib cage along eighth rib.  No rash, crepitus, or edema.  Skin:    General: Skin is warm and dry.     Capillary Refill: Capillary refill takes less than 2 seconds.  Neurological:     Mental Status: He is alert.  Psychiatric:        Mood and Affect: Mood normal.     ED Results / Procedures / Treatments   Labs (all labs ordered are listed, but only abnormal results are  displayed) Labs Reviewed - No data to display  EKG None  Radiology DG Ribs Unilateral W/Chest Right  Result Date: 08/08/2022 CLINICAL DATA:  Rib pain with cough. Right-sided rib pain. Cough for 4 days. EXAM: RIGHT RIBS AND CHEST - 3+ VIEW COMPARISON:  Chest radiograph 04/09/2015 FINDINGS: No fracture or other bone lesions are seen involving the ribs. No focal abnormality subjacent to BB placed at site of maximal pain. There is no evidence of pneumothorax or pleural effusion. Both lungs are clear. Heart size and mediastinal contours are within normal limits. IMPRESSION: Negative radiographs of the chest and right ribs. Electronically Signed   By: Keith Rake M.D.   On: 08/08/2022 11:25    Procedures Procedures   Medications Ordered in ED Medications  lidocaine (LIDODERM) 5 % 1 patch (1 patch Transdermal Patch Applied 08/08/22 1249)  benzonatate (TESSALON) capsule 200 mg (has no  administration in time range)  ibuprofen (ADVIL) tablet 600 mg (600 mg Oral Given 08/08/22 1249)    ED Course/ Medical Decision Making/ A&P                             Medical Decision Making Patient is a 37 year old male, here for cough for the last couple days, and now right-sided posterior rib cage pain.  On exam he has no crepitus, rash, or edema.  His lungs are clear to auscultation.  We will obtain a chest x-ray with ribs, to evaluate for possible fractured ribs.  And give him some Tessalon Perles ibuprofen and lidocaine for the pain.  Amount and/or Complexity of Data Reviewed Radiology: ordered.    Details: Chest x-ray clear. Discussion of management or test interpretation with external provider(s): Chest x-ray with ribs unremarkable, likely secondary to costochondritis from frequent cough.  We discussed smoking cessation, prescribed Tessalon Perles, and lidocaine patches as well as ibuprofen.  Return precautions emphasized.  He is PERC negative, and has no chest pain that would resemble ACS, we discussed return precautions and he voiced understanding.  He is well-appearing not requiring oxygen.  No rash.   Final Clinical Impression(s) / ED Diagnoses Final diagnoses:  Costochondritis  Viral upper respiratory tract infection    Rx / DC Orders ED Discharge Orders          Ordered    benzonatate (TESSALON) 100 MG capsule  Every 8 hours        08/08/22 1245    lidocaine (HM LIDOCAINE PATCH) 4 %  Every 24 hours        08/08/22 1245    ibuprofen (ADVIL) 400 MG tablet  Every 6 hours PRN        08/08/22 1245              Corday Wyka, [place], [place] 08/08/22 1250    Charlesetta Shanks, MD 08/11/22 1821

## 2022-08-08 NOTE — ED Notes (Signed)
Discharge paperwork reviewed entirely with patient, including Rx's and follow up care. Pain was under control. Pt verbalized understanding as well as all parties involved. No questions or concerns voiced at the time of discharge. No acute distress noted.   Pt ambulated out to PVA without incident or assistance.

## 2022-09-22 ENCOUNTER — Encounter: Payer: Self-pay | Admitting: *Deleted

## 2023-05-13 ENCOUNTER — Emergency Department (HOSPITAL_BASED_OUTPATIENT_CLINIC_OR_DEPARTMENT_OTHER): Payer: MEDICAID

## 2023-05-13 ENCOUNTER — Encounter (HOSPITAL_BASED_OUTPATIENT_CLINIC_OR_DEPARTMENT_OTHER): Payer: Self-pay

## 2023-05-13 ENCOUNTER — Other Ambulatory Visit: Payer: Self-pay

## 2023-05-13 ENCOUNTER — Emergency Department (HOSPITAL_BASED_OUTPATIENT_CLINIC_OR_DEPARTMENT_OTHER)
Admission: EM | Admit: 2023-05-13 | Discharge: 2023-05-13 | Disposition: A | Discharge status: Home / Self Care | Payer: MEDICAID | Attending: Emergency Medicine | Admitting: Emergency Medicine

## 2023-05-13 DIAGNOSIS — R0602 Shortness of breath: Secondary | ICD-10-CM | POA: Diagnosis not present

## 2023-05-13 DIAGNOSIS — R059 Cough, unspecified: Secondary | ICD-10-CM | POA: Diagnosis present

## 2023-05-13 DIAGNOSIS — Z20822 Contact with and (suspected) exposure to covid-19: Secondary | ICD-10-CM | POA: Insufficient documentation

## 2023-05-13 DIAGNOSIS — R0789 Other chest pain: Secondary | ICD-10-CM | POA: Insufficient documentation

## 2023-05-13 DIAGNOSIS — J189 Pneumonia, unspecified organism: Secondary | ICD-10-CM

## 2023-05-13 LAB — BASIC METABOLIC PANEL
Anion gap: 12 (ref 5–15)
BUN: 13 mg/dL (ref 6–20)
CO2: 21 mmol/L — ABNORMAL LOW (ref 22–32)
Calcium: 8.6 mg/dL — ABNORMAL LOW (ref 8.9–10.3)
Chloride: 103 mmol/L (ref 98–111)
Creatinine, Ser: 1.33 mg/dL — ABNORMAL HIGH (ref 0.61–1.24)
GFR, Estimated: >60 mL/min (ref >60)
Glucose, Bld: 173 mg/dL — ABNORMAL HIGH (ref 70–99)
Potassium: 3 mmol/L — ABNORMAL LOW (ref 3.5–5.1)
Sodium: 136 mmol/L (ref 135–145)

## 2023-05-13 LAB — CBC
HCT: 40.5 % (ref 39.0–52.0)
Hemoglobin: 14.2 g/dL (ref 13.0–17.0)
MCH: 32.2 pg (ref 26.0–34.0)
MCHC: 35.1 g/dL (ref 30.0–36.0)
MCV: 91.8 fL (ref 80.0–100.0)
Platelets: 242 10*3/uL (ref 150–400)
RBC: 4.41 MIL/uL (ref 4.22–5.81)
RDW: 12.7 % (ref 11.5–15.5)
WBC: 5.1 10*3/uL (ref 4.0–10.5)
nRBC: 0 % (ref 0.0–0.2)

## 2023-05-13 LAB — RESP PANEL BY RT-PCR (RSV, FLU A&B, COVID)  RVPGX2
Influenza A by PCR: NEGATIVE
Influenza B by PCR: NEGATIVE
Resp Syncytial Virus by PCR: NEGATIVE
SARS Coronavirus 2 by RT PCR: NEGATIVE

## 2023-05-13 LAB — TROPONIN I (HIGH SENSITIVITY)
Troponin I (High Sensitivity): 9 ng/L (ref <18)
Troponin I (High Sensitivity): 9 ng/L (ref <18)

## 2023-05-13 MED ORDER — DOXYCYCLINE HYCLATE 100 MG PO CAPS: 100.0000 mg | ORAL_CAPSULE | Freq: Two times a day (BID) | ORAL | 0 refills | Status: AC

## 2023-05-13 MED ORDER — POTASSIUM CHLORIDE 10 MEQ/100ML IV SOLN
10.0000 meq | Freq: Once | INTRAVENOUS | Status: AC
  Administered 2023-05-13: 10 meq via INTRAVENOUS
  Filled 2023-05-13: qty 100

## 2023-05-13 NOTE — ED Triage Notes (Signed)
The patient is having cough, shortness of breath and chest pain for one week. Nebs are not helping.

## 2023-05-13 NOTE — ED Provider Notes (Signed)
[facility] Provider Note   CSN: [id number] Arrival date & time: 05/13/23  1207     History  Chief Complaint  Patient presents with   Chest Pain    Jerry George is a 37 y.o. male past medical history significant for bronchitis presents today for cough, shortness of breath, and intermittent chest pain x 1 week.  Patient notes chest pain under bilateral pectoralis and in the back when coughing.  Patient reports productive cough and congestion.  Patient denies fever, chills, nausea, vomiting, sore throat, or abdominal pain.  Patient has tried using nebs he got when he had bronchitis last year, but reports they have not helped.   Chest Pain Associated symptoms: shortness of breath        Home Medications Prior to Admission medications   Medication Sig Start Date End Date Taking? Authorizing Provider  amLODipine (NORVASC) 5 MG tablet Take 1 tablet (5 mg total) by mouth daily. 07/23/20   Little, Ambrose Finland, MD  benzonatate (TESSALON) 100 MG capsule Take 1 capsule (100 mg total) by mouth every 8 (eight) hours. 08/08/22   Small, Brooke L, PA  ibuprofen (ADVIL) 400 MG tablet Take 1 tablet (400 mg total) by mouth every 6 (six) hours as needed. 08/08/22   Small, Brooke L, PA  lidocaine (HM LIDOCAINE PATCH) 4 % Place 1 patch onto the skin daily. 08/08/22   Small, Brooke L, PA  naproxen (NAPROSYN) 500 MG tablet Take 1 tablet (500 mg total) by mouth 2 (two) times daily with a meal. 09/16/16   Georgiana Shore, PA-C      Allergies    Patient has no known allergies.    Review of Systems   Review of Systems  HENT:  Positive for congestion.   Respiratory:  Positive for shortness of breath.   Cardiovascular:  Positive for chest pain.    Physical Exam Updated Vital Signs BP (!) 149/95   Pulse 92   Temp (!) 97.3 F (36.3 C)   Resp (!) 22   Ht 5\' 4"  (1.626 m)   Wt 104.3 kg   SpO2 99%   BMI 39.48 kg/m  Physical Exam Vitals and nursing  note reviewed.  Constitutional:      General: He is not in acute distress.    Appearance: He is well-developed. He is not ill-appearing.  HENT:     Head: Normocephalic and atraumatic.     Jaw: There is normal jaw occlusion.     Right Ear: External ear normal.     Left Ear: External ear normal.     Nose: Congestion present.     Right Turbinates: Enlarged.     Left Turbinates: Enlarged.     Mouth/Throat:     Mouth: Mucous membranes are moist.     Pharynx: Uvula midline. No oropharyngeal exudate or uvula swelling.     Tonsils: No tonsillar abscesses.  Eyes:     Conjunctiva/sclera: Conjunctivae normal.     Pupils: Pupils are equal, round, and reactive to light.  Cardiovascular:     Rate and Rhythm: Normal rate and regular rhythm.     Heart sounds: No murmur heard. Pulmonary:     Effort: Pulmonary effort is normal. No tachypnea or respiratory distress.     Breath sounds: Normal breath sounds. No decreased breath sounds, wheezing, rhonchi or rales.  Chest:     Chest wall: Tenderness present.     Comments: Reproducible tenderness to bilateral pectoralis muscles Abdominal:  Palpations: Abdomen is soft.     Tenderness: There is no abdominal tenderness.  Musculoskeletal:        General: No swelling.     Cervical back: Neck supple.  Skin:    General: Skin is warm and dry.     Capillary Refill: Capillary refill takes less than 2 seconds.  Neurological:     Mental Status: He is alert.  Psychiatric:        Mood and Affect: Mood normal.     ED Results / Procedures / Treatments   Labs (all labs ordered are listed, but only abnormal results are displayed) Labs Reviewed  RESP PANEL BY RT-PCR (RSV, FLU A&B, COVID)  [id number]  BASIC METABOLIC PANEL  CBC  TROPONIN I (HIGH SENSITIVITY)    EKG None  Radiology No results found.  Procedures Procedures    Medications Ordered in ED Medications - No data to display  ED Course/ Medical Decision Making/ A&P                                  Medical Decision Making Amount and/or Complexity of Data Reviewed Labs: ordered. Radiology: ordered.   This patient presents to the ED with chief complaint(s) of cough, shortness of breath, chest pain with pertinent past medical history of bronchitis which further complicates the presenting complaint. The complaint involves an extensive differential diagnosis and also carries with it a high risk of complications and morbidity.    The differential diagnosis includes upper respiratory infection, COVID, flu, STEMI, NSTEMI, pneumonia   Additional history obtained: Records reviewed Care Everywhere/External Records  ED Course and Reassessment: Administered IV potassium  Independent labs interpretation:  The following labs were independently interpreted:  CBC: No notable findings BMP: Hypokalemia 3, decreased CO2 at 21, elevated creatinine 1.33, mild hypocalcemia at 8.6 Troponin: 9 Respiratory panel: Negative EKG: Sinus rhythm, abnormal R wave progression, nonspecific T wave abnormality, prolonged QT interval  Independent visualization of imaging: - I independently visualized the following imaging with scope of interpretation limited to determining acute life threatening conditions related to emergency care: Chest x-ray, which revealed patchy left retrocardiac opacity atelectasis or pneumonia  Consultation: - Consulted or discussed management/test interpretation w/ external professional: None  Consideration for admission or further workup: Considered for admission or further workup however patient's vital signs, physical exam, labs, and imaging have all been reassuring.  Patient's symptoms likely due to pneumonia and will be treated outpatient on course of antibiotics.         Final Clinical Impression(s) / ED Diagnoses Final diagnoses:  None    Rx / DC Orders ED Discharge Orders     None         Dolphus Jenny, PA-C 05/13/23 1415    Alvira Monday,  MD 05/13/23 2150

## 2023-05-13 NOTE — Discharge Instructions (Signed)
Today you were seen for pneumonia.  Please pick up your antibiotic and take as prescribed.  Please follow-up with your PCP for further evaluation and treatment if symptoms persist.  Thank you for letting us treat you today. After reviewing your labs and imaging, I feel you are safe to go home. Please follow up with your PCP in the next several days and provide them with your records from this visit. Return to the Emergency Room if pain becomes severe or symptoms worsen.

## 2023-08-31 NOTE — Progress Notes (Signed)
 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  He gets some cramping in his toes and his foot and gets dorsal medial and plantar midfoot pain with occasional tingling.  Some pain at night but mostly with increased standing.  It swells some.  Remember he had tarsometatarsal fixation on his foot just under 2 years ago  Physical Exam: The patient is alert and attentive, pleasant and cooperative, well kempt, and of stated age.  Good affect.  No distress.  He does have a dorsal prominence at the first tarsometatarsal joint that is focal and does not seem tender.  A little tenderness near the fourth MTP region but no other tender area.  Good motion of the toes and metatarsals with a little pain on translation of the first through 4 metatarsals and the fifth MTP region.  Good ankle hindfoot and midfoot motion with little pain on midfoot motion.  The midfoot is a little bit stiff.  Good foot shape.  He walks well.  Today obtain some history from his mother  Today reviewed his emergency room note from [facility] from August 24, 2023  Today reviewed his labs.  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  This represents exacerbation of a chronic medical condition.   Plan: Mobic 15 mg daily and turf toe insert with lateral wedge.  It looks like he wears good shoes to work by his description.  If he struggles on glad to consider injecting the tarsometatarsal region.  No work  restrictions provided.  Call as needed.  Treatment risk category moderate for prescription drug management.  X-ray: XR Foot Minimum 3 Views Right Dorsal squaring and a little irregularity at the first tarsometatarsal  joint.  No fracture or bony lesion or other findings.  Impression: Findings consistent with some medial tarsometatarsal arthritis  of the right foot   Allergies  Patient has no known allergies.  Medications    Current Outpatient Medications  Medication Sig Dispense Refill  . busPIRone (BUSPAR) 10 mg tablet Take 1 tablet (10 mg total) by mouth 3 (three) times a day. 90 tablet 1  . divalproex (DEPAKOTE DR) 500 mg 12 hr tablet Take 1 tablet (500 mg total) by mouth every 12 (twelve) hours. 60 tablet 1  . meloxicam (MOBIC) 15 mg tablet Take 1 tablet (15 mg total) by mouth daily. 30 tablet 0  . propranoloL (INDERAL) 10 mg tablet Take 1 tablet (10 mg total) by mouth 2 (two) times a day. 60 tablet 1   No current facility-administered medications for this visit.    Past Medical History  Past Medical History:  Diagnosis Date  . Addiction to drug (CMD)   . Alcohol abuse   . Anxiety   . Cocaine abuse (CMD)   . Depression   . Hematemesis   . Hypertension   . Pancreatitis   . Sleep difficulties   . Suicidal ideations   . Withdrawal symptoms, alcohol (CMD)  Past Surgical History  Past Surgical History:  Procedure Laterality Date  . DENTAL SURGERY      Procedure: DENTAL SURGERY  . FOOT HARDWARE REMOVAL Right 02/04/2022   Procedure: HARDWARE REMOVAL FOOT;  Surgeon: Duwaine Ozell Rush, MD;  Location: [facility] OR;  Service: Orthopedics;  Laterality: Right;  c-arm, Synthes 4 mm cannulated screwdriver  . FOOT SURGERY Right 10/29/2021   Procedure: OPEN TREATMENT MIDFOOT FRACTURE;  Surgeon: Ozell Rush Duwaine, MD;  Location: [facility];  Service: Orthopedics;  Laterality: Right;  c-arm, Synthes small fragment screws + cannulated screws; Synthes 2.4 mm  screws; Large tenaculum clamp    Family History  Family History  Problem Relation Name Age of Onset  . Hypertension Mother    . Diabetes type II Maternal Grandmother    . Hypertension Maternal Grandmother    . Diabetes type II Maternal Grandfather    . Hypertension Maternal Grandfather    . Arterial stenosis Maternal Grandfather    . Colon cancer Neg Hx    . Colon polyps Neg Hx    . Crohn's disease Neg Hx    . Inflammatory bowel disease Neg Hx    . Clotting disorder Neg Hx      Social History:  Social History   Socioeconomic History  . Marital status: Married    Spouse name: Not on file  . Number of children: Not on file  . Years of education: Not on file  . Highest education level: Not on file  Occupational History  . Not on file  Tobacco Use  . Smoking status: Every Day    Current packs/day: 0.50    Types: Cigarettes  . Smokeless tobacco: Never  Vaping Use  . Vaping status: Never Used  Substance and Sexual Activity  . Alcohol use: Yes    Alcohol/week: 11.0 standard drinks of alcohol    Comment: states that he is drinking a gallon of alcohol daily  . Drug use: Not on file    Comment: Patient states that he uses a gram of cocaine once monthly  . Sexual activity: Not on file    Comment: not assessed  Other Topics Concern  . Not on file  Social History Narrative  . Not on file   Social Drivers of Health   Food Insecurity: Not on file  Transportation Needs: Not on file  Safety: Not on file  Living Situation: Not on file    Review of Systems   Vital Signs  BP (!) 155/103   Pulse 70   Temp 98.2 F (36.8 C)   Ht 1.727 m (5' 8)   Wt 102 kg (225 lb)   SpO2 100%   BMI 34.21 kg/m  Body mass index is 34.21 kg/m.  Test Results  No results found for this or any previous visit. No results found for this or any previous visit (from the past 48 hours).  Problem List  Patient Active Problem List  Diagnosis  . MDD (major depressive disorder),  recurrent episode, severe (HCC)  . Lisfranc dislocation, right, initial encounter  . Abrasion of left ankle  . Lisfranc dislocation, right, subsequent encounter  . Painful orthopaedic hardware (HCC)  . Alcohol use disorder, severe, dependence (CMD)  . Cocaine use disorder, moderate, dependence (CMD)  . Bipolar 1 disorder (CMD)  . GAD (generalized anxiety disorder)  . Pre-diabetes  . Benign essential HTN       Ozell PARAS. Duwaine, MD

## 2023-08-31 NOTE — Telephone Encounter (Signed)
 Pt's mom is calling in states that  medication that Dr. Duwaine was to send in is not at pharmacy  Pt can be reached at [phone]

## 2023-08-31 NOTE — Telephone Encounter (Signed)
 I called and LMOVM advising that medication was sent to the pharmacy and conformation that it was received at 12:40 today

## 2024-01-09 ENCOUNTER — Other Ambulatory Visit: Payer: Self-pay

## 2024-01-09 ENCOUNTER — Emergency Department (HOSPITAL_BASED_OUTPATIENT_CLINIC_OR_DEPARTMENT_OTHER)
Admission: EM | Admit: 2024-01-09 | Discharge: 2024-01-09 | Disposition: A | Discharge status: Home / Self Care | Payer: MEDICAID | Attending: Emergency Medicine | Admitting: Emergency Medicine

## 2024-01-09 ENCOUNTER — Encounter (HOSPITAL_BASED_OUTPATIENT_CLINIC_OR_DEPARTMENT_OTHER): Payer: Self-pay | Admitting: Emergency Medicine

## 2024-01-09 ENCOUNTER — Emergency Department (HOSPITAL_BASED_OUTPATIENT_CLINIC_OR_DEPARTMENT_OTHER): Payer: MEDICAID

## 2024-01-09 DIAGNOSIS — M79672 Pain in left foot: Secondary | ICD-10-CM | POA: Diagnosis present

## 2024-01-09 DIAGNOSIS — Z79899 Other long term (current) drug therapy: Secondary | ICD-10-CM | POA: Insufficient documentation

## 2024-01-09 DIAGNOSIS — F17200 Nicotine dependence, unspecified, uncomplicated: Secondary | ICD-10-CM | POA: Diagnosis not present

## 2024-01-09 DIAGNOSIS — I10 Essential (primary) hypertension: Secondary | ICD-10-CM | POA: Diagnosis not present

## 2024-01-09 HISTORY — DX: Essential (primary) hypertension: I10

## 2024-01-09 MED ORDER — AMLODIPINE BESYLATE 5 MG PO TABS: 5.0000 mg | ORAL_TABLET | Freq: Every day | ORAL | 0 refills | Status: AC

## 2024-01-09 MED ORDER — AMLODIPINE BESYLATE 5 MG PO TABS
5.0000 mg | ORAL_TABLET | Freq: Once | ORAL | Status: AC
  Administered 2024-01-09: 5 mg via ORAL
  Filled 2024-01-09: qty 1

## 2024-01-09 MED ORDER — IBUPROFEN 800 MG PO TABS: 800.0000 mg | ORAL_TABLET | Freq: Three times a day (TID) | ORAL | 0 refills | Status: AC | PRN

## 2024-01-09 MED ORDER — IBUPROFEN 800 MG PO TABS
800.0000 mg | ORAL_TABLET | Freq: Once | ORAL | Status: AC
  Administered 2024-01-09: 800 mg via ORAL
  Filled 2024-01-09: qty 1

## 2024-01-09 NOTE — ED Triage Notes (Addendum)
 2 days ago woke up and his left foot isswelling and painful hurts bear weight can wiggle toes had good  pulse neuro and can feel me touch, states  no new trauma to foot   States has run out of HTN meds

## 2024-01-09 NOTE — Discharge Instructions (Addendum)
 Thank you for letting us  evaluate you today.  I have sent ibuprofen , amlodipine  to your pharmacy.  Please take as prescribed.  Please make sure to get your next PCP appointment.  I have also provided you with podiatry follow-up for left foot pain.  Return to Emergency Department if you experience red hot swollen foot, loss of sensation in foot.

## 2024-01-09 NOTE — ED Provider Notes (Signed)
 [place] [facility] Provider Note   CSN: [id number] Arrival date & time: 01/09/24  1208     Patient presents with: Foot Pain   Jerry George is a 38 y.o. male with past medical history of HTN, GERD, polysubstance abuse presents to Emergency Department for evaluation of left foot pain over the past 2 days.  Reports that he has noted some left foot swelling.  Has tried Tylenol  at home without relief.  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  He sought ED evaluation as it is extremely painful to bear weight on left foot.  Denies fevers, known trauma.    Foot Pain       Prior to Admission medications   Medication Sig Start Date End Date Taking? Authorizing Provider  amLODipine  (NORVASC ) 5 MG tablet Take 1 tablet (5 mg total) by mouth daily. 01/09/24 02/20/24 Yes Minnie Tinnie BRAVO, PA  ibuprofen  (ADVIL ) 800 MG tablet Take 1 tablet (800 mg total) by mouth every 8 (eight) hours as needed for mild pain (pain score 1-3) or moderate pain (pain score 4-6). 01/09/24  Yes Minnie Tinnie BRAVO, PA  amLODipine  (NORVASC ) 5 MG tablet Take 1 tablet (5 mg total) by mouth daily. 07/23/20   Little, Vernell Search, MD  benzonatate  (TESSALON ) 100 MG capsule Take 1 capsule (100 mg total) by mouth every 8 (eight) hours. 08/08/22   Small, Brooke L, PA  doxycycline  (VIBRAMYCIN ) 100 MG capsule Take 1 capsule (100 mg total) by mouth 2 (two) times daily. 05/13/23   Keith, Kayla N, PA-C  ibuprofen  (ADVIL ) 400 MG tablet Take 1 tablet (400 mg total) by mouth every 6 (six) hours as needed. 08/08/22   Small, Brooke L, PA  lidocaine  (HM LIDOCAINE  PATCH) 4 % Place 1 patch onto the skin daily. 08/08/22   Small, Brooke L, PA  naproxen  (NAPROSYN ) 500 MG tablet Take 1 tablet (500 mg total) by mouth 2 (two) times daily with a meal. 09/16/16   Merilee Harlene NOVAK, PA-C    Allergies: Patient has no known allergies.    Review of Systems  Musculoskeletal:  Positive  for joint swelling.    Updated Vital Signs BP (!) 176/127 (BP Location: Left Arm)   Pulse 86   Temp 98.5 F (36.9 C)   Resp 18   SpO2 98%   Physical Exam Vitals and nursing note reviewed.  Constitutional:      General: He is not in acute distress.    Appearance: Normal appearance.  HENT:     Head: Normocephalic and atraumatic.  Eyes:     Conjunctiva/sclera: Conjunctivae normal.  Cardiovascular:     Rate and Rhythm: Normal rate.  Pulmonary:     Effort: Pulmonary effort is normal. No respiratory distress.  Musculoskeletal:     Right lower leg: No edema.     Left lower leg: No edema.     Comments: Generalized TTP of dorsal aspect plantar aspect of left foot.  No bony calcaneal nor ankle tenderness.  No erythema, warmth, gross swelling, laceration, abrasion, deformity appreciated.  DP 2+ equally.  Sensation 2/2 of LLE, digits. Motor 5/5 of left digits 1-5, left dorsi/plantar flexion  Skin:    Coloration: Skin is not jaundiced or pale.  Neurological:     Mental Status: He is alert. Mental status is at baseline.     (all labs ordered are listed, but only abnormal results are displayed) Labs Reviewed - No data to  display  EKG: None  Radiology: DG Foot Complete Left Result Date: 01/09/2024 CLINICAL DATA:  Swelling, pain EXAM: LEFT FOOT - COMPLETE 3+ VIEW COMPARISON:  None Available. FINDINGS: There is no evidence of fracture or dislocation. There is no evidence of arthropathy or other focal bone abnormality. Soft tissues are unremarkable. IMPRESSION: Negative. Electronically Signed   By: Franky Crease M.D.   On: 01/09/2024 12:54     Medications Ordered in the ED  amLODipine  (NORVASC ) tablet 5 mg (has no administration in time range)  ibuprofen  (ADVIL ) tablet 800 mg (has no administration in time range)                                    Medical Decision Making Amount and/or Complexity of Data Reviewed Radiology: ordered.   Patient presents to the ED for concern of  medication refill, left foot pain, this involves an extensive number of treatment options, and is a complaint that carries with it a high risk of complications and morbidity.  The differential diagnosis includes hypertensive crisis, gout, cellulitis, osteomyelitis, sprain, strain, OA, tendinitis, gout, fracture, contusion, abrasion, septic joint   Co morbidities that complicate the patient evaluation  HTN   Additional history obtained:  Additional history obtained from Nursing and Outside Medical Records   External records from outside source obtained and reviewed including triage note    Imaging Studies ordered:  I ordered imaging studies including left foot x-ray I independently visualized and interpreted imaging which showed no acute abnormality I agree with the radiologist interpretation    Medicines ordered and prescription drug management:  I ordered medication including amlodipine , ibuprofen  for known HTN, pain Reevaluation of the patient after these medicines showed that the patient improved I have reviewed the patients home medicines and have made adjustments as needed    Problem List / ED Course:  Left foot pain TTP of dorsal aspect and plantar aspect of left foot.  No TTP of calcaneus.  Sensation intact.  Able to wiggle digits and move left ankle WNL.  Do not appreciate any erythema, warmth, swelling, break in skin integrity. No localized tenderness. Ddx include tendonitis, stress fracture, fascitis, overuse, sprain/strain. Do not see emergent cause of symptoms requiring further workup in ED Low suspicion for gout as he has no history nor is it erythematous nor swollen Low suspicion for septic joint, cellulitis, osteo, infection as there is no swelling, erythema, nor signs of infection.  No fever no tachycardia. XR without signs of osseous abnormality Discussed symptomatic care to include RICE, decreased time on feet Will provide CAM boot, crutches per pt request as  it is painful to WB and have pt f/u with podiatry  HTN Has history of hypertension and takes amlodipine  5 daily.  Wishes to have refill of hypertensive medication. Has PCP appointment on September 12 and needs prescription until then Will provide a dose here Emergency Department and prescription until he is able to follow-up with primary care next month Low suspicion for hypertensive urgency/emergency.  No complaints of headache, blurred vision, dizziness, chest pain.  Is neurologically intact with no motor or sensory deficits.   Reevaluation:  After the interventions noted above, I reevaluated the patient and found that they have :improved   Social Determinants of Health:  Current tobacco use Has PCP appointment on February 17 2024   Dispostion:  After consideration of the diagnostic results and the patients response to treatment,  I feel that the patent would benefit from outpatient management symptomatic care and podiatry follow-up.   Discussed ED workup, disposition, return to ED precautions with patient who expresses understanding agrees with plan.  All questions answered to their satisfaction.  They are agreeable to plan.  Discharge instructions provided on paperwork  Final diagnoses:  Foot pain, left  Hypertension, unspecified type    ED Discharge Orders          Ordered    amLODipine  (NORVASC ) 5 MG tablet  Daily        01/09/24 1412    ibuprofen  (ADVIL ) 800 MG tablet  Every 8 hours PRN        01/09/24 1412             Minnie Tinnie BRAVO, PA 01/09/24 1427    Darra Fonda MATSU, MD 01/12/24 1105
